# Patient Record
Sex: Male | Born: 1945 | Race: Black or African American | Hispanic: No | Marital: Married | Smoking: Former smoker
Health system: Southern US, Community
[De-identification: ages and names within clinical notes are randomized; demographics above are authoritative.]

## PROBLEM LIST (undated history)

## (undated) DIAGNOSIS — Z974 Presence of external hearing-aid: Secondary | ICD-10-CM

## (undated) DIAGNOSIS — E785 Hyperlipidemia, unspecified: Secondary | ICD-10-CM

## (undated) DIAGNOSIS — H9193 Unspecified hearing loss, bilateral: Secondary | ICD-10-CM

## (undated) DIAGNOSIS — G4733 Obstructive sleep apnea (adult) (pediatric): Secondary | ICD-10-CM

## (undated) DIAGNOSIS — Z8546 Personal history of malignant neoplasm of prostate: Secondary | ICD-10-CM

## (undated) DIAGNOSIS — N489 Disorder of penis, unspecified: Secondary | ICD-10-CM

## (undated) DIAGNOSIS — Z87442 Personal history of urinary calculi: Secondary | ICD-10-CM

## (undated) DIAGNOSIS — J309 Allergic rhinitis, unspecified: Secondary | ICD-10-CM

## (undated) DIAGNOSIS — C61 Malignant neoplasm of prostate: Secondary | ICD-10-CM

## (undated) DIAGNOSIS — I10 Essential (primary) hypertension: Secondary | ICD-10-CM

## (undated) DIAGNOSIS — Z9989 Dependence on other enabling machines and devices: Secondary | ICD-10-CM

## (undated) DIAGNOSIS — C801 Malignant (primary) neoplasm, unspecified: Secondary | ICD-10-CM

## (undated) HISTORY — DX: Malignant (primary) neoplasm, unspecified: C80.1

## (undated) HISTORY — DX: Unspecified hearing loss, bilateral: H91.93

## (undated) HISTORY — PX: INGUINAL HERNIA REPAIR: SUR1180

## (undated) HISTORY — PX: PROSTATE SURGERY: SHX751

## (undated) HISTORY — PX: CARDIAC CATHETERIZATION: SHX172

---

## 2002-09-09 ENCOUNTER — Encounter: Payer: Self-pay | Admitting: Family Medicine

## 2002-09-09 ENCOUNTER — Encounter: Admission: RE | Admit: 2002-09-09 | Discharge: 2002-09-09 | Payer: Self-pay | Admitting: Family Medicine

## 2002-10-21 ENCOUNTER — Encounter: Payer: Self-pay | Admitting: Family Medicine

## 2002-10-21 ENCOUNTER — Encounter: Admission: RE | Admit: 2002-10-21 | Discharge: 2002-10-21 | Payer: Self-pay | Admitting: Family Medicine

## 2006-03-22 ENCOUNTER — Emergency Department (HOSPITAL_COMMUNITY): Admission: EM | Admit: 2006-03-22 | Discharge: 2006-03-22 | Payer: Self-pay | Admitting: Emergency Medicine

## 2006-03-27 ENCOUNTER — Encounter: Admission: RE | Admit: 2006-03-27 | Discharge: 2006-03-27 | Payer: Self-pay | Admitting: Family Medicine

## 2007-12-19 ENCOUNTER — Emergency Department (HOSPITAL_COMMUNITY): Admission: EM | Admit: 2007-12-19 | Discharge: 2007-12-20 | Payer: Self-pay | Admitting: Emergency Medicine

## 2009-07-12 ENCOUNTER — Ambulatory Visit (HOSPITAL_COMMUNITY): Admission: RE | Admit: 2009-07-12 | Discharge: 2009-07-12 | Payer: Self-pay | Admitting: Urology

## 2009-08-04 ENCOUNTER — Ambulatory Visit (HOSPITAL_COMMUNITY): Admission: RE | Admit: 2009-08-04 | Discharge: 2009-08-04 | Payer: Self-pay | Admitting: Urology

## 2009-08-21 ENCOUNTER — Emergency Department (HOSPITAL_COMMUNITY): Admission: EM | Admit: 2009-08-21 | Discharge: 2009-08-21 | Payer: Self-pay | Admitting: Family Medicine

## 2009-11-15 ENCOUNTER — Encounter (INDEPENDENT_AMBULATORY_CARE_PROVIDER_SITE_OTHER): Payer: Self-pay | Admitting: Urology

## 2009-11-15 ENCOUNTER — Inpatient Hospital Stay (HOSPITAL_COMMUNITY): Admission: RE | Admit: 2009-11-15 | Discharge: 2009-11-16 | Payer: Self-pay | Admitting: Urology

## 2009-11-15 HISTORY — PX: ROBOT ASSISTED LAPAROSCOPIC RADICAL PROSTATECTOMY: SHX5141

## 2010-12-19 LAB — COMPREHENSIVE METABOLIC PANEL
Albumin: 4 g/dL (ref 3.5–5.2)
Alkaline Phosphatase: 64 U/L (ref 39–117)
BUN: 10 mg/dL (ref 6–23)
Calcium: 9.3 mg/dL (ref 8.4–10.5)
Creatinine, Ser: 1.15 mg/dL (ref 0.4–1.5)
Glucose, Bld: 116 mg/dL — ABNORMAL HIGH (ref 70–99)
Total Protein: 7.2 g/dL (ref 6.0–8.3)

## 2010-12-19 LAB — BASIC METABOLIC PANEL
CO2: 27 mEq/L (ref 19–32)
Calcium: 8.3 mg/dL — ABNORMAL LOW (ref 8.4–10.5)
Chloride: 107 mEq/L (ref 96–112)
Creatinine, Ser: 1.18 mg/dL (ref 0.4–1.5)
GFR calc Af Amer: >60 mL/min (ref >60)
Glucose, Bld: 141 mg/dL — ABNORMAL HIGH (ref 70–99)

## 2010-12-19 LAB — TYPE AND SCREEN
ABO/RH(D): B POS
Antibody Screen: NEGATIVE

## 2010-12-19 LAB — URINALYSIS, ROUTINE W REFLEX MICROSCOPIC
Bilirubin Urine: NEGATIVE
Glucose, UA: NEGATIVE mg/dL
Hgb urine dipstick: NEGATIVE
Ketones, ur: NEGATIVE mg/dL
Protein, ur: NEGATIVE mg/dL
pH: 7.5 (ref 5.0–8.0)

## 2010-12-19 LAB — CBC
HCT: 43.1 % (ref 39.0–52.0)
Hemoglobin: 14.4 g/dL (ref 13.0–17.0)
MCHC: 33.5 g/dL (ref 30.0–36.0)
MCV: 91.2 fL (ref 78.0–100.0)
Platelets: 173 10*3/uL (ref 150–400)
RDW: 14.6 % (ref 11.5–15.5)

## 2010-12-19 LAB — APTT: aPTT: 35 seconds (ref 24–37)

## 2010-12-19 LAB — PROTIME-INR: INR: 1.05 (ref 0.00–1.49)

## 2010-12-19 LAB — CREATININE, FLUID (PLEURAL, PERITONEAL, JP DRAINAGE): Creat, Fluid: 1.1 mg/dL

## 2010-12-19 LAB — HEMOGLOBIN AND HEMATOCRIT, BLOOD: HCT: 38 % — ABNORMAL LOW (ref 39.0–52.0)

## 2011-01-02 LAB — CBC
HCT: 41.6 % (ref 39.0–52.0)
Hemoglobin: 14.3 g/dL (ref 13.0–17.0)
MCHC: 34.3 g/dL (ref 30.0–36.0)
MCV: 90.8 fL (ref 78.0–100.0)
Platelets: 168 10*3/uL (ref 150–400)
RBC: 4.58 MIL/uL (ref 4.22–5.81)
RDW: 14.6 % (ref 11.5–15.5)
WBC: 6.9 10*3/uL (ref 4.0–10.5)

## 2011-01-02 LAB — POCT I-STAT, CHEM 8
Chloride: 102 mEq/L (ref 96–112)
Creatinine, Ser: 1.2 mg/dL (ref 0.4–1.5)
HCT: 45 % (ref 39.0–52.0)
Hemoglobin: 15.3 g/dL (ref 13.0–17.0)
Potassium: 4.1 mEq/L (ref 3.5–5.1)
Sodium: 138 mEq/L (ref 135–145)

## 2011-01-02 LAB — PROTIME-INR
INR: 1.05 (ref 0.00–1.49)
Prothrombin Time: 13.6 seconds (ref 11.6–15.2)

## 2011-01-02 LAB — APTT: aPTT: 37 seconds (ref 24–37)

## 2011-06-24 LAB — DIFFERENTIAL
Basophils Absolute: 0
Basophils Relative: 0
Eosinophils Absolute: 0.3
Eosinophils Relative: 3
Lymphocytes Relative: 37
Lymphs Abs: 3.5
Monocytes Absolute: 0.8
Monocytes Relative: 8
Neutro Abs: 5
Neutrophils Relative %: 52

## 2011-06-24 LAB — URINALYSIS, ROUTINE W REFLEX MICROSCOPIC
Bilirubin Urine: NEGATIVE
Glucose, UA: NEGATIVE
Hgb urine dipstick: NEGATIVE
Ketones, ur: NEGATIVE
Nitrite: NEGATIVE
Protein, ur: NEGATIVE
Specific Gravity, Urine: 1.01
Urobilinogen, UA: 1
pH: 6.5

## 2011-06-24 LAB — BASIC METABOLIC PANEL
Chloride: 99
GFR calc Af Amer: >60
GFR calc non Af Amer: >60
Potassium: 3 — ABNORMAL LOW
Sodium: 136

## 2011-06-24 LAB — BASIC METABOLIC PANEL WITH GFR
BUN: 16
CO2: 28
Calcium: 9.2
Creatinine, Ser: 1.07
Glucose, Bld: 120 — ABNORMAL HIGH

## 2011-06-24 LAB — CBC
HCT: 43.3
Hemoglobin: 14.3
MCHC: 32.9
MCV: 88.9
Platelets: 212
RBC: 4.87
RDW: 15.6 — ABNORMAL HIGH
WBC: 9.6

## 2011-06-24 LAB — POCT CARDIAC MARKERS
Operator id: 4533
Troponin i, poc: <0.05

## 2012-04-18 ENCOUNTER — Encounter (HOSPITAL_COMMUNITY): Payer: Self-pay | Admitting: *Deleted

## 2012-04-18 ENCOUNTER — Emergency Department (HOSPITAL_COMMUNITY): Payer: BC Managed Care – PPO

## 2012-04-18 ENCOUNTER — Observation Stay (HOSPITAL_COMMUNITY)
Admission: EM | Admit: 2012-04-18 | Discharge: 2012-04-18 | Disposition: A | Discharge status: Home / Self Care | Payer: BC Managed Care – PPO | Attending: Emergency Medicine | Admitting: Emergency Medicine

## 2012-04-18 ENCOUNTER — Observation Stay (HOSPITAL_COMMUNITY): Payer: BC Managed Care – PPO

## 2012-04-18 DIAGNOSIS — E7800 Pure hypercholesterolemia, unspecified: Secondary | ICD-10-CM

## 2012-04-18 DIAGNOSIS — R209 Unspecified disturbances of skin sensation: Secondary | ICD-10-CM | POA: Insufficient documentation

## 2012-04-18 DIAGNOSIS — I10 Essential (primary) hypertension: Principal | ICD-10-CM

## 2012-04-18 DIAGNOSIS — R42 Dizziness and giddiness: Secondary | ICD-10-CM | POA: Insufficient documentation

## 2012-04-18 DIAGNOSIS — R4789 Other speech disturbances: Secondary | ICD-10-CM | POA: Insufficient documentation

## 2012-04-18 DIAGNOSIS — R202 Paresthesia of skin: Secondary | ICD-10-CM

## 2012-04-18 HISTORY — DX: Essential (primary) hypertension: I10

## 2012-04-18 LAB — COMPREHENSIVE METABOLIC PANEL
ALT: 28 U/L (ref 0–53)
AST: 33 U/L (ref 0–37)
AST: 24 U/L (ref 0–37)
Albumin: 3.9 g/dL (ref 3.5–5.2)
Alkaline Phosphatase: 60 U/L (ref 39–117)
BUN: 13 mg/dL (ref 6–23)
CO2: 25 mEq/L (ref 19–32)
Chloride: 103 mEq/L (ref 96–112)
Chloride: 104 mEq/L (ref 96–112)
Creatinine, Ser: 0.97 mg/dL (ref 0.50–1.35)
GFR calc Af Amer: >90 mL/min (ref >90)
GFR calc non Af Amer: 85 mL/min — ABNORMAL LOW (ref >90)
Glucose, Bld: 124 mg/dL — ABNORMAL HIGH (ref 70–99)
Potassium: 3.9 mEq/L (ref 3.5–5.1)
Total Bilirubin: 0.4 mg/dL (ref 0.3–1.2)
Total Bilirubin: 0.3 mg/dL (ref 0.3–1.2)

## 2012-04-18 LAB — CBC
HCT: 40.8 % (ref 39.0–52.0)
Hemoglobin: 14.1 g/dL (ref 13.0–17.0)
MCH: 30.6 pg (ref 26.0–34.0)
MCV: 88.5 fL (ref 78.0–100.0)
Platelets: 188 10*3/uL (ref 150–400)
RBC: 4.61 MIL/uL (ref 4.22–5.81)
WBC: 6.5 10*3/uL (ref 4.0–10.5)

## 2012-04-18 LAB — URINALYSIS, ROUTINE W REFLEX MICROSCOPIC
Bilirubin Urine: NEGATIVE
Hgb urine dipstick: NEGATIVE
Ketones, ur: NEGATIVE mg/dL
Specific Gravity, Urine: 1.013 (ref 1.005–1.030)
Urobilinogen, UA: 0.2 mg/dL (ref 0.0–1.0)

## 2012-04-18 LAB — LIPID PANEL
HDL: 44 mg/dL (ref >39)
LDL Cholesterol: 155 mg/dL — ABNORMAL HIGH (ref 0–99)
Triglycerides: 124 mg/dL (ref <150)
VLDL: 25 mg/dL (ref 0–40)

## 2012-04-18 LAB — CBC WITH DIFFERENTIAL/PLATELET
Basophils Absolute: 0 10*3/uL (ref 0.0–0.1)
Basophils Relative: 0 % (ref 0–1)
Hemoglobin: 14 g/dL (ref 13.0–17.0)
MCHC: 34.6 g/dL (ref 30.0–36.0)
Monocytes Relative: 8 % (ref 3–12)
Neutro Abs: 3 10*3/uL (ref 1.7–7.7)
Neutrophils Relative %: 37 % — ABNORMAL LOW (ref 43–77)
RDW: 14.3 % (ref 11.5–15.5)

## 2012-04-18 LAB — PROTIME-INR: INR: 0.95 (ref 0.00–1.49)

## 2012-04-18 LAB — HEMOGLOBIN A1C: Hgb A1c MFr Bld: 6.1 % — ABNORMAL HIGH (ref <5.7)

## 2012-04-18 MED ORDER — ATORVASTATIN CALCIUM 10 MG PO TABS: 20.0000 mg | ORAL_TABLET | Freq: Every day | ORAL | Status: DC

## 2012-04-18 MED ORDER — ASPIRIN 81 MG PO CHEW: 81.0000 mg | CHEWABLE_TABLET | Freq: Every day | ORAL | Status: AC

## 2012-04-18 MED ORDER — STROKE: EARLY STAGES OF RECOVERY BOOK
Freq: Once | Status: DC
  Filled 2012-04-18: qty 1

## 2012-04-18 NOTE — ED Notes (Signed)
The pt says his bp has been high for the past 2 weeks and his rt foot feels hot and his arms are jumping

## 2012-04-18 NOTE — ED Provider Notes (Signed)
History     CSN: [id number]  Arrival date & time 04/18/12  0127   First MD Initiated Contact with Patient 04/18/12 [phone]      Chief Complaint  Patient presents with  . Hypertension    (Consider location/radiation/quality/duration/timing/severity/associated sxs/prior treatment) HPI History per patient. aRound 1:30 AM today he noted dizziness with slurred speech and right hand tingling. Also some very mild right foot tingling. No weakness. No difficulty with gait. Patient checked his blood pressure noted was systolic 200 and became concerned and presents here for evaluation. States this has happened a few times in the past, has seen his primary care physician and increased his lisinopril. Currently speech has come back to normal. No headache. Symptoms otherwise resolved. No history of stroke or TIA. No chest pain or shortness of breath. No palpitations. No recent illness. Symptoms mild/moderate severity. Past Medical History  Diagnosis Date  . Hypertension     History reviewed. No pertinent past surgical history.  No family history on file.  History  Substance Use Topics  . Smoking status: Never Smoker   . Smokeless tobacco: Not on file  . Alcohol Use: Yes      Review of Systems  Constitutional: Negative for fever and chills.  HENT: Negative for neck pain and neck stiffness.   Eyes: Negative for pain.  Respiratory: Negative for shortness of breath.   Cardiovascular: Negative for chest pain, palpitations and leg swelling.  Gastrointestinal: Negative for abdominal pain.  Genitourinary: Negative for dysuria.  Musculoskeletal: Negative for back pain.  Skin: Negative for rash.  Neurological: Positive for dizziness. Negative for weakness.  All other systems reviewed and are negative.    Allergies  Review of patient's allergies indicates no known allergies.  Home Medications   Current Outpatient Rx  Name Route Sig Dispense Refill  . CETIRIZINE HCL 10 MG PO TABS Oral  Take 10 mg by mouth daily.    Marland Kitchen LISINOPRIL 20 MG PO TABS Oral Take 20 mg by mouth daily.    Marland Kitchen POTASSIUM 95 MG PO TABS Oral Take 1 tablet by mouth daily.      BP 133/71  Pulse 90  Temp 98.6 F (37 C) (Oral)  Resp 18  SpO2 98%  Physical Exam  Constitutional: He is oriented to person, place, and time. He appears well-developed and well-nourished.  HENT:  Head: Normocephalic and atraumatic.  Eyes: Conjunctivae and EOM are normal. Pupils are equal, round, and reactive to light.  Neck: Full passive range of motion without pain. Neck supple. No thyromegaly present.       No meningismus  Cardiovascular: Normal rate, regular rhythm, S1 normal, S2 normal and intact distal pulses.   Pulmonary/Chest: Effort normal and breath sounds normal.  Abdominal: Soft. Bowel sounds are normal. There is no tenderness. There is no CVA tenderness.  Musculoskeletal: Normal range of motion.  Neurological: He is alert and oriented to person, place, and time. He has normal strength and normal reflexes. No cranial nerve deficit or sensory deficit. He displays a negative Romberg sign. GCS eye subscore is 4. GCS verbal subscore is 5. GCS motor subscore is 6.       Normal Gait  Skin: Skin is warm and dry. No rash noted. No cyanosis. Nails show no clubbing.  Psychiatric: He has a normal mood and affect. His speech is normal and behavior is normal.    ED Course  Procedures (including critical care time)  Results for orders placed during the hospital encounter of 04/18/12  CBC WITH DIFFERENTIAL      Component Value Range   WBC 7.9  4.0 - 10.5 K/uL   RBC 4.57  4.22 - 5.81 MIL/uL   Hemoglobin 14.0  13.0 - 17.0 g/dL   HCT 96.0  45.4 - 09.8 %   MCV 88.6  78.0 - 100.0 fL   MCH 30.6  26.0 - 34.0 pg   MCHC 34.6  30.0 - 36.0 g/dL   RDW 11.9  14.7 - 82.9 %   Platelets 178  150 - 400 K/uL   Neutrophils Relative 37 (*) 43 - 77 %   Neutro Abs 3.0  1.7 - 7.7 K/uL   Lymphocytes Relative 51 (*) 12 - 46 %   Lymphs Abs 4.1  (*) 0.7 - 4.0 K/uL   Monocytes Relative 8  3 - 12 %   Monocytes Absolute 0.6  0.1 - 1.0 K/uL   Eosinophils Relative 4  0 - 5 %   Eosinophils Absolute 0.3  0.0 - 0.7 K/uL   Basophils Relative 0  0 - 1 %   Basophils Absolute 0.0  0.0 - 0.1 K/uL  COMPREHENSIVE METABOLIC PANEL      Component Value Range   Sodium 139  135 - 145 mEq/L   Potassium 3.9  3.5 - 5.1 mEq/L   Chloride 103  96 - 112 mEq/L   CO2 22  19 - 32 mEq/L   Glucose, Bld 120 (*) 70 - 99 mg/dL   BUN 14  6 - 23 mg/dL   Creatinine, Ser 5.62  0.50 - 1.35 mg/dL   Calcium 9.0  8.4 - 13.0 mg/dL   Total Protein 7.0  6.0 - 8.3 g/dL   Albumin 3.9  3.5 - 5.2 g/dL   AST 33  0 - 37 U/L   ALT 28  0 - 53 U/L   Alkaline Phosphatase 60  39 - 117 U/L   Total Bilirubin 0.4  0.3 - 1.2 mg/dL   GFR calc non Af Amer 74 (*) >90 mL/min   GFR calc Af Amer 86 (*) >90 mL/min  CBC      Component Value Range   WBC 6.5  4.0 - 10.5 K/uL   RBC 4.61  4.22 - 5.81 MIL/uL   Hemoglobin 14.1  13.0 - 17.0 g/dL   HCT 86.5  78.4 - 69.6 %   MCV 88.5  78.0 - 100.0 fL   MCH 30.6  26.0 - 34.0 pg   MCHC 34.6  30.0 - 36.0 g/dL   RDW 29.5  28.4 - 13.2 %   Platelets 188  150 - 400 K/uL  COMPREHENSIVE METABOLIC PANEL      Component Value Range   Sodium 140  135 - 145 mEq/L   Potassium 4.0  3.5 - 5.1 mEq/L   Chloride 104  96 - 112 mEq/L   CO2 25  19 - 32 mEq/L   Glucose, Bld 124 (*) 70 - 99 mg/dL   BUN 13  6 - 23 mg/dL   Creatinine, Ser 4.40  0.50 - 1.35 mg/dL   Calcium 9.1  8.4 - 10.2 mg/dL   Total Protein 7.0  6.0 - 8.3 g/dL   Albumin 3.8  3.5 - 5.2 g/dL   AST 24  0 - 37 U/L   ALT 25  0 - 53 U/L   Alkaline Phosphatase 57  39 - 117 U/L   Total Bilirubin 0.3  0.3 - 1.2 mg/dL   GFR calc non Af Amer 85 (*) >  90 mL/min   GFR calc Af Amer >90  >90 mL/min  PROTIME-INR      Component Value Range   Prothrombin Time 12.9  11.6 - 15.2 seconds   INR 0.95  0.00 - 1.49  APTT      Component Value Range   aPTT 32  24 - 37 seconds  URINALYSIS, ROUTINE W REFLEX  MICROSCOPIC      Component Value Range   Color, Urine YELLOW  YELLOW   APPearance CLEAR  CLEAR   Specific Gravity, Urine 1.013  1.005 - 1.030   pH 7.0  5.0 - 8.0   Glucose, UA NEGATIVE  NEGATIVE mg/dL   Hgb urine dipstick NEGATIVE  NEGATIVE   Bilirubin Urine NEGATIVE  NEGATIVE   Ketones, ur NEGATIVE  NEGATIVE mg/dL   Protein, ur NEGATIVE  NEGATIVE mg/dL   Urobilinogen, UA 0.2  0.0 - 1.0 mg/dL   Nitrite NEGATIVE  NEGATIVE   Leukocytes, UA NEGATIVE  NEGATIVE  LIPID PANEL      Component Value Range   Cholesterol 224 (*) 0 - 200 mg/dL   Triglycerides 308  <657 mg/dL   HDL 44  >84 mg/dL   Total CHOL/HDL Ratio 5.1     VLDL 25  0 - 40 mg/dL   LDL Cholesterol 696 (*) 0 - 99 mg/dL   Ct Head Wo Contrast  04/18/2012  *RADIOLOGY REPORT*  Clinical Data: Dizziness.  Tingling in the right fingers. Hypertension.  CT HEAD WITHOUT CONTRAST  Technique:  Contiguous axial images were obtained from the base of the skull through the vertex without contrast.  Comparison: 12/20/2007  Findings: Faint calcification in the globus pallidus nuclei noted, left greater than right, stable and likely physiologic.  Otherwise, the brain stem, cerebellum, cerebral peduncles, thalami, basal ganglia, basilar cisterns, and ventricular system appear unremarkable.  No intracranial hemorrhage, mass lesion, or acute infarction is identified.  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  IMPRESSION:  1.  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   Otherwise, no significant abnormality identified.  Original Report Authenticated By: Dellia Cloud, M.D.   Dg Chest Portable 1 View  04/18/2012  *RADIOLOGY REPORT*  Clinical Data: Hypertension.  Lightheaded.  Chills.  Numbness in the right hand.  PORTABLE CHEST - 1 VIEW  Comparison: 11/13/2009  Findings: Shallow inspiration. The heart size and pulmonary vascularity are normal. The lungs appear clear and expanded without focal air space disease or consolidation.  No blunting of the costophrenic angles.  No significant change since previous study.  IMPRESSION: No evidence of active pulmonary disease.  Original Report Authenticated By: Marlon Pel, M.D.     Date: 04/18/2012  Rate: 74  Rhythm: normal sinus rhythm  QRS Axis: normal  Intervals: normal  ST/T Wave abnormalities: nonspecific ST changes  Conduction Disutrbances:none  Narrative Interpretation:   Old EKG Reviewed: unchanged  Patient placed on CDU observation TIA protocol.  [facility] patient. TIA symptoms. ABCD squared score = 5 MDM   Nursing notes reviewed. Vital signs reviewed. Blood pressure improving. Imaging, EKG and labs obtained and reviewed as above. On recheck at 5 AM exam unchanged.        Sunnie Nielsen, MD 04/18/12 [phone]

## 2012-04-18 NOTE — ED Notes (Signed)
Patient transported to CT 

## 2012-04-18 NOTE — ED Provider Notes (Signed)
Pt seen and examined by me in CDU. Pt on TIA protochol over night. Pt with an episode of hypertension and tingling in right hand and right leg yesterday, all now resolved. Pending pt's echo.   Results for orders placed during the hospital encounter of 04/18/12  CBC WITH DIFFERENTIAL      Component Value Range   WBC 7.9  4.0 - 10.5 K/uL   RBC 4.57  4.22 - 5.81 MIL/uL   Hemoglobin 14.0  13.0 - 17.0 g/dL   HCT 09.8  11.9 - 14.7 %   MCV 88.6  78.0 - 100.0 fL   MCH 30.6  26.0 - 34.0 pg   MCHC 34.6  30.0 - 36.0 g/dL   RDW 82.9  56.2 - 13.0 %   Platelets 178  150 - 400 K/uL   Neutrophils Relative 37 (*) 43 - 77 %   Neutro Abs 3.0  1.7 - 7.7 K/uL   Lymphocytes Relative 51 (*) 12 - 46 %   Lymphs Abs 4.1 (*) 0.7 - 4.0 K/uL   Monocytes Relative 8  3 - 12 %   Monocytes Absolute 0.6  0.1 - 1.0 K/uL   Eosinophils Relative 4  0 - 5 %   Eosinophils Absolute 0.3  0.0 - 0.7 K/uL   Basophils Relative 0  0 - 1 %   Basophils Absolute 0.0  0.0 - 0.1 K/uL  COMPREHENSIVE METABOLIC PANEL      Component Value Range   Sodium 139  135 - 145 mEq/L   Potassium 3.9  3.5 - 5.1 mEq/L   Chloride 103  96 - 112 mEq/L   CO2 22  19 - 32 mEq/L   Glucose, Bld 120 (*) 70 - 99 mg/dL   BUN 14  6 - 23 mg/dL   Creatinine, Ser 8.65  0.50 - 1.35 mg/dL   Calcium 9.0  8.4 - 78.4 mg/dL   Total Protein 7.0  6.0 - 8.3 g/dL   Albumin 3.9  3.5 - 5.2 g/dL   AST 33  0 - 37 U/L   ALT 28  0 - 53 U/L   Alkaline Phosphatase 60  39 - 117 U/L   Total Bilirubin 0.4  0.3 - 1.2 mg/dL   GFR calc non Af Amer 74 (*) >90 mL/min   GFR calc Af Amer 86 (*) >90 mL/min  CBC      Component Value Range   WBC 6.5  4.0 - 10.5 K/uL   RBC 4.61  4.22 - 5.81 MIL/uL   Hemoglobin 14.1  13.0 - 17.0 g/dL   HCT 69.6  29.5 - 28.4 %   MCV 88.5  78.0 - 100.0 fL   MCH 30.6  26.0 - 34.0 pg   MCHC 34.6  30.0 - 36.0 g/dL   RDW 13.2  44.0 - 10.2 %   Platelets 188  150 - 400 K/uL  COMPREHENSIVE METABOLIC PANEL      Component Value Range   Sodium 140  135 -  145 mEq/L   Potassium 4.0  3.5 - 5.1 mEq/L   Chloride 104  96 - 112 mEq/L   CO2 25  19 - 32 mEq/L   Glucose, Bld 124 (*) 70 - 99 mg/dL   BUN 13  6 - 23 mg/dL   Creatinine, Ser 7.25  0.50 - 1.35 mg/dL   Calcium 9.1  8.4 - 36.6 mg/dL   Total Protein 7.0  6.0 - 8.3 g/dL   Albumin 3.8  3.5 - 5.2  g/dL   AST 24  0 - 37 U/L   ALT 25  0 - 53 U/L   Alkaline Phosphatase 57  39 - 117 U/L   Total Bilirubin 0.3  0.3 - 1.2 mg/dL   GFR calc non Af Amer 85 (*) >90 mL/min   GFR calc Af Amer >90  >90 mL/min  PROTIME-INR      Component Value Range   Prothrombin Time 12.9  11.6 - 15.2 seconds   INR 0.95  0.00 - 1.49  APTT      Component Value Range   aPTT 32  24 - 37 seconds  URINALYSIS, ROUTINE W REFLEX MICROSCOPIC      Component Value Range   Color, Urine YELLOW  YELLOW   APPearance CLEAR  CLEAR   Specific Gravity, Urine 1.013  1.005 - 1.030   pH 7.0  5.0 - 8.0   Glucose, UA NEGATIVE  NEGATIVE mg/dL   Hgb urine dipstick NEGATIVE  NEGATIVE   Bilirubin Urine NEGATIVE  NEGATIVE   Ketones, ur NEGATIVE  NEGATIVE mg/dL   Protein, ur NEGATIVE  NEGATIVE mg/dL   Urobilinogen, UA 0.2  0.0 - 1.0 mg/dL   Nitrite NEGATIVE  NEGATIVE   Leukocytes, UA NEGATIVE  NEGATIVE  LIPID PANEL      Component Value Range   Cholesterol 224 (*) 0 - 200 mg/dL   Triglycerides 062  <694 mg/dL   HDL 44  >85 mg/dL   Total CHOL/HDL Ratio 5.1     VLDL 25  0 - 40 mg/dL   LDL Cholesterol 462 (*) 0 - 99 mg/dL   Ct Head Wo Contrast  04/18/2012  *RADIOLOGY REPORT*  Clinical Data: Dizziness.  Tingling in the right fingers. Hypertension.  CT HEAD WITHOUT CONTRAST  Technique:  Contiguous axial images were obtained from the base of the skull through the vertex without contrast.  Comparison: 12/20/2007  Findings: Faint calcification in the globus pallidus nuclei noted, left greater than right, stable and likely physiologic.  Otherwise, the brain stem, cerebellum, cerebral peduncles, thalami, basal ganglia, basilar cisterns, and  ventricular system appear unremarkable.  No intracranial hemorrhage, mass lesion, or acute infarction is identified.  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  IMPRESSION:  1.  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   Otherwise, no significant abnormality identified.  Original Report Authenticated By: Dellia Cloud, M.D.   Mr Brain Wo Contrast  04/18/2012  *RADIOLOGY REPORT*  Clinical Data:  66 year old male with dizziness, slurred speech, right hand tingling.  Comparison: Head CT 04/18/2012 and earlier.  MRI HEAD WITHOUT CONTRAST  Technique: Multiplanar, multiecho pulse sequences of the brain and surrounding structures were obtained according to standard protocol without intravenous contrast.  Findings: No restricted diffusion to suggest acute infarction. Major intracranial vascular flow voids are preserved; dominant distal left vertebral artery suspected.  Partially empty sella configuration.  No ventriculomegaly. No midline shift, mass effect, or evidence of mass lesion.  No acute intracranial hemorrhage identified.  Negative cervicomedullary junction and visualized cervical spine.  Normal bone marrow signal. Gray-white matter signal is within normal limits for age throughout the brain.  Minor paranasal sinus mucosal thickening.  Mastoids are clear. Visualized orbits and scalp soft tissues are within normal limits.  IMPRESSION: 1. No acute intracranial abnormality.  Normal for age noncontrast MRI appearance of the brain. 2.  MRA findings are below.  MRA HEAD WITHOUT CONTRAST  Technique: Angiographic images of the Circle of Willis were obtained using MRA technique without  intravenous contrast.  Findings: Antegrade flow in the posterior circulation which is supplied by the distal left vertebral artery.  A diminutive right vertebral artery terminates in PICA.  Normal left PICA.  No basilar stenosis.  Normal superior cerebellar and posterior cerebral artery origins.  Normal  right posterior communicating artery.  The left is diminutive.  Bilateral PCA branches are within normal limits.  Antegrade flow in both ICA siphons.  Bilateral cavernous segment irregularity compatible with atherosclerosis.  No hemodynamically significant ICA stenosis.  The ophthalmic and right posterior communicating artery origins are within normal limits.  The left posterior communicating artery origin is not well visualized, but a 2 mm outpouching from the distal left ICA directed inferiorly probably is an infundibulum at the left Pcomm origin.  Ectatic right ICA terminus.  MCA and ACA origins are within normal limits.  Diminutive anterior communicating artery.  Visualized ACA branches are within normal limits.  Visualized right MCA branches are within normal limits.  Visualized left MCA branches are within normal limits.  IMPRESSION: 1.  No hemodynamically significant stenosis or major circle of Willis branch occlusion. 2.  ICA siphon atherosclerosis. 3.  2 mm distal left ICA infundibulum. 4.  Dominant left vertebral artery, the right terminates in PICA.  Original Report Authenticated By: Harley Hallmark, M.D.   Dg Chest Portable 1 View  04/18/2012  *RADIOLOGY REPORT*  Clinical Data: Hypertension.  Lightheaded.  Chills.  Numbness in the right hand.  PORTABLE CHEST - 1 VIEW  Comparison: 11/13/2009  Findings: Shallow inspiration. The heart size and pulmonary vascularity are normal. The lungs appear clear and expanded without focal air space disease or consolidation. No blunting of the costophrenic angles.  No significant change since previous study.  IMPRESSION: No evidence of active pulmonary disease.  Original Report Authenticated By: Marlon Pel, M.D.   Mr Mra Head/brain Wo Cm  04/18/2012  *RADIOLOGY REPORT*  Clinical Data:  66 year old male with dizziness, slurred speech, right hand tingling.  Comparison: Head CT 04/18/2012 and earlier.  MRI HEAD WITHOUT CONTRAST  Technique: Multiplanar,  multiecho pulse sequences of the brain and surrounding structures were obtained according to standard protocol without intravenous contrast.  Findings: No restricted diffusion to suggest acute infarction. Major intracranial vascular flow voids are preserved; dominant distal left vertebral artery suspected.  Partially empty sella configuration.  No ventriculomegaly. No midline shift, mass effect, or evidence of mass lesion.  No acute intracranial hemorrhage identified.  Negative cervicomedullary junction and visualized cervical spine.  Normal bone marrow signal. Gray-white matter signal is within normal limits for age throughout the brain.  Minor paranasal sinus mucosal thickening.  Mastoids are clear. Visualized orbits and scalp soft tissues are within normal limits.  IMPRESSION: 1. No acute intracranial abnormality.  Normal for age noncontrast MRI appearance of the brain. 2.  MRA findings are below.  MRA HEAD WITHOUT CONTRAST  Technique: Angiographic images of the Circle of Willis were obtained using MRA technique without  intravenous contrast.  Findings: Antegrade flow in the posterior circulation which is supplied by the distal left vertebral artery.  A diminutive right vertebral artery terminates in PICA.  Normal left PICA.  No basilar stenosis.  Normal superior cerebellar and posterior cerebral artery origins.  Normal right posterior communicating artery.  The left is diminutive.  Bilateral PCA branches are within normal limits.  Antegrade flow in both ICA siphons.  Bilateral cavernous segment irregularity compatible with atherosclerosis.  No hemodynamically significant ICA stenosis.  The ophthalmic and right posterior communicating  artery origins are within normal limits.  The left posterior communicating artery origin is not well visualized, but a 2 mm outpouching from the distal left ICA directed inferiorly probably is an infundibulum at the left Pcomm origin.  Ectatic right ICA terminus.  MCA and ACA  origins are within normal limits.  Diminutive anterior communicating artery.  Visualized ACA branches are within normal limits.  Visualized right MCA branches are within normal limits.  Visualized left MCA branches are within normal limits.  IMPRESSION: 1.  No hemodynamically significant stenosis or major circle of Willis branch occlusion. 2.  ICA siphon atherosclerosis. 3.  2 mm distal left ICA infundibulum. 4.  Dominant left vertebral artery, the right terminates in PICA.  Original Report Authenticated By: Harley Hallmark, M.D.    12:54 PM Pt's work up here unremarkable other than elevated cholesterol of 224, with LDL of 155. Pt states he used to be on medications, does not remember the name, but came off about 2 years ago because his cholesterol was improving. Pt states he has also had an allergic reaction to some medication, he does not remember the name of it, also does not remember what type of reaction he had. Pt's wife out of the room now, will ask her when she comes back. Pt seems to think she would know.   Exam.: Pt in NAD, PERRLA, neck is supple, no bruits. Lungs are clear to ausculation bilat, regular HR and rhythm. Abdomen soft, non tender. 5/5 and equal strength of both extremities, equal grip strength bilaterally, cranial nerves intact.  Filed Vitals:   04/18/12 1000  BP: 142/78  Pulse: 58  Temp:   Resp: 13    Plan: Pt will be started on a cholesterol medication. Follow up with PCP. He is a [place] patient. Also follow up for better BP control.     Lottie Mussel, PA 04/18/12 2252

## 2012-04-18 NOTE — Progress Notes (Signed)
  Echocardiogram 2D Echocardiogram has been performed.  [place] 04/18/2012, 9:32 AM

## 2012-04-18 NOTE — Progress Notes (Signed)
VASCULAR LAB PRELIMINARY  PRELIMINARY  PRELIMINARY  PRELIMINARY  Carotid duplex  completed.    Preliminary report:  Bilateral:  No evidence of hemodynamically significant internal carotid artery stenosis.   Vertebral artery flow is antegrade.      Kale Rondeau, RVT 04/18/2012, 8:41 AM

## 2012-04-18 NOTE — ED Notes (Signed)
Pt's belongings which were shoes, pants, belt, shirt, and hat were taken to CDU 12.

## 2012-04-18 NOTE — ED Notes (Addendum)
Report given to [facility], RN and pt will be transferred to CDU 12 after MRI.

## 2012-04-19 NOTE — ED Provider Notes (Signed)
Medical screening examination/treatment/procedure(s) were performed by non-physician practitioner and as supervising physician I was immediately available for consultation/collaboration.  Sunnie Nielsen, MD 04/19/12 [phone]

## 2012-05-13 NOTE — Progress Notes (Signed)
Observation review is complete for this date.

## 2012-06-30 ENCOUNTER — Emergency Department (INDEPENDENT_AMBULATORY_CARE_PROVIDER_SITE_OTHER)
Admission: EM | Admit: 2012-06-30 | Discharge: 2012-06-30 | Disposition: A | Discharge status: Nursing Home (Includes ICF) | Payer: BC Managed Care – PPO | Source: Home / Self Care | Attending: Emergency Medicine | Admitting: Emergency Medicine

## 2012-06-30 ENCOUNTER — Observation Stay (HOSPITAL_COMMUNITY)
Admission: EM | Admit: 2012-06-30 | Discharge: 2012-07-01 | Disposition: A | Discharge status: Home / Self Care | Payer: BC Managed Care – PPO | Attending: Internal Medicine | Admitting: Internal Medicine

## 2012-06-30 ENCOUNTER — Emergency Department (HOSPITAL_COMMUNITY): Payer: BC Managed Care – PPO

## 2012-06-30 ENCOUNTER — Encounter (HOSPITAL_COMMUNITY): Payer: Self-pay | Admitting: Adult Health

## 2012-06-30 ENCOUNTER — Encounter (HOSPITAL_COMMUNITY): Payer: Self-pay | Admitting: *Deleted

## 2012-06-30 DIAGNOSIS — I10 Essential (primary) hypertension: Secondary | ICD-10-CM

## 2012-06-30 DIAGNOSIS — R61 Generalized hyperhidrosis: Secondary | ICD-10-CM | POA: Insufficient documentation

## 2012-06-30 DIAGNOSIS — R42 Dizziness and giddiness: Secondary | ICD-10-CM

## 2012-06-30 DIAGNOSIS — R110 Nausea: Secondary | ICD-10-CM | POA: Insufficient documentation

## 2012-06-30 DIAGNOSIS — R079 Chest pain, unspecified: Secondary | ICD-10-CM

## 2012-06-30 DIAGNOSIS — E785 Hyperlipidemia, unspecified: Secondary | ICD-10-CM | POA: Insufficient documentation

## 2012-06-30 DIAGNOSIS — R202 Paresthesia of skin: Secondary | ICD-10-CM

## 2012-06-30 DIAGNOSIS — Z8249 Family history of ischemic heart disease and other diseases of the circulatory system: Secondary | ICD-10-CM | POA: Insufficient documentation

## 2012-06-30 DIAGNOSIS — I208 Other forms of angina pectoris: Secondary | ICD-10-CM

## 2012-06-30 DIAGNOSIS — R55 Syncope and collapse: Secondary | ICD-10-CM | POA: Insufficient documentation

## 2012-06-30 DIAGNOSIS — R0602 Shortness of breath: Secondary | ICD-10-CM | POA: Insufficient documentation

## 2012-06-30 DIAGNOSIS — R911 Solitary pulmonary nodule: Secondary | ICD-10-CM | POA: Insufficient documentation

## 2012-06-30 DIAGNOSIS — R209 Unspecified disturbances of skin sensation: Secondary | ICD-10-CM

## 2012-06-30 DIAGNOSIS — R072 Precordial pain: Secondary | ICD-10-CM

## 2012-06-30 HISTORY — DX: Malignant neoplasm of prostate: C61

## 2012-06-30 HISTORY — DX: Hyperlipidemia, unspecified: E78.5

## 2012-06-30 LAB — BASIC METABOLIC PANEL
BUN: 15 mg/dL (ref 6–23)
CO2: 26 mEq/L (ref 19–32)
Chloride: 99 mEq/L (ref 96–112)
GFR calc non Af Amer: 61 mL/min — ABNORMAL LOW (ref >90)
Glucose, Bld: 107 mg/dL — ABNORMAL HIGH (ref 70–99)
Potassium: 4.4 mEq/L (ref 3.5–5.1)

## 2012-06-30 LAB — CBC
HCT: 46.3 % (ref 39.0–52.0)
Hemoglobin: 15.6 g/dL (ref 13.0–17.0)
WBC: 8.9 10*3/uL (ref 4.0–10.5)

## 2012-06-30 LAB — POCT I-STAT TROPONIN I: Troponin i, poc: 0 ng/mL (ref 0.00–0.08)

## 2012-06-30 MED ORDER — ASPIRIN 81 MG PO CHEW
324.0000 mg | CHEWABLE_TABLET | Freq: Once | ORAL | Status: AC
  Administered 2012-06-30: 324 mg via ORAL

## 2012-06-30 MED ORDER — NITROGLYCERIN 0.4 MG SL SUBL: 0.4000 mg | SUBLINGUAL_TABLET | SUBLINGUAL | Status: DC | PRN

## 2012-06-30 MED ORDER — ASPIRIN 325 MG PO TABS
325.0000 mg | ORAL_TABLET | ORAL | Status: AC
  Administered 2012-06-30: 325 mg via ORAL
  Filled 2012-06-30: qty 1

## 2012-06-30 MED ORDER — ASPIRIN 81 MG PO CHEW
CHEWABLE_TABLET | ORAL | Status: AC
  Filled 2012-06-30: qty 1

## 2012-06-30 NOTE — H&P (Signed)
PCP: Renne Musca  HPI:  Dalton Martin is a 66 y/o male with h/o HTN, HL and prostate cancer (s/p prostatectomy). Who presents with CP.  He denies any h/o known heart disease. Never had a stress test or cath. Had right hand and leg numbness in 7/13. Came to the ER and had TIA protocol. Echo EF 60-65%. Carotids normal. Head MRI normal.  Over past few months has had multiple episodes of presyncope. Then develops soreness/pressure in chest. Mild dyspnea. No associated diaphoresis, n/v. Typically BP goes up with these events up to 180. Then goes away slowly. No flushing or palpitations. Episodes getting more frequent - has had several episodes in the past week.  This morning BP was fine with systolics in 110-120s. Was able to walk 30 mins on treadmill this morning and felt fine. Tonight was going out to buy some gas and then began to feel lightheaded and developed pressure in his chest. Felt like he couldn't make it back home so went to Urgent Care and was brought to ER. ECG and cardiac markers normal. Now feels fine. Has been checking BP a lot recently. BP good in am then goes into 180s at night.  No seizure activity. No orthostasis.   Review of Systems:     Cardiac Review of Systems: {Y] = yes [ ]  = no  Chest Pain [ y  ]  Resting SOB [   ] Exertional SOB  [  ]  Orthopnea [  ]   Pedal Edema [   ]    Palpitations [  ] Syncope  [  ]   Presyncope [place]   ]  General Review of Systems: [Y] = yes [  ]=no Constitional: recent weight change [  ]; anorexia [  ]; fatigue [  ]; nausea [  ]; night sweats [  ]; fever [  ]; or chills [  ];                                                                                                                                          Dental: poor dentition[  ];   Eye : blurred vision [  ]; diplopia [   ]; vision changes [  ];  Amaurosis fugax[  ]; Resp: cough [  ];  wheezing[  ];  hemoptysis[  ]; shortness of breath[  ]; paroxysmal nocturnal dyspnea[  ]; dyspnea on  exertion[  ]; or orthopnea[  ];  GI:  gallstones[  ], vomiting[  ];  dysphagia[  ]; melena[  ];  hematochezia [  ]; heartburn[  ];  GU: kidney stones [  ]; hematuria[  ];   dysuria [  ];  nocturia[  ];  history of     obstruction [  ];                 Skin: rash, swelling[  ];,  hair loss[  ];  peripheral edema[  ];  or itching[  ]; Musculosketetal: myalgias[  ];  joint swelling[  ];  joint erythema[  ];  joint pain[  ];  back pain[  ];  Heme/Lymph: bruising[  ];  bleeding[  ];  anemia[  ];  Neuro: TIA[  ];  headaches[  ];  stroke[  ];  vertigo[  ];  seizures[  ];   paresthesias[  ];  difficulty walking[  ];  Psych:depression[  ]; anxiety[  ];  Endocrine: diabetes[  ];  thyroid dysfunction[  ];  Other:  Past Medical History  Diagnosis Date  . Hypertension   . Hyperlipidemia   . Prostate cancer     s/p prostatectomy 2011     No Known Allergies  History   Social History  . Marital Status: Married    Spouse Name: N/A    Number of Children: N/A  . Years of Education: N/A   Occupational History  . Not on file.   Social History Main Topics  . Smoking status: Never Smoker   . Smokeless tobacco: Not on file  . Alcohol Use: Yes  . Drug Use: No  . Sexually Active:    Other Topics Concern  . Not on file   Social History Narrative  . No narrative on file    History reviewed. No pertinent family history.  Brother died MI at age 38 Mother died with cancer Father died with cancer 65 4 sisters alive and well  PHYSICAL EXAM: Filed Vitals:   06/30/12 2323  BP: 150/78  Pulse: 65  Temp:   Resp: 19   General:  Well appearing. No respiratory difficulty HEENT: normal Neck: supple. no JVD. Carotids 2+ bilat; no bruits. No lymphadenopathy or thryomegaly appreciated. Cor: PMI nondisplaced. Regular rate & rhythm. No rubs, gallops or murmurs. Lungs: clear Abdomen: soft, nontender, nondistended. No hepatosplenomegaly. No bruits or masses. Good bowel sounds. Extremities: no  cyanosis, clubbing, rash, edema Neuro: alert & oriented x 3, cranial nerves grossly intact. moves all 4 extremities w/o difficulty. Affect pleasant.  ECG: Sinus brady 58 No ST-T wave abnormalities.    Results for orders placed during the hospital encounter of 06/30/12 (from the past 24 hour(s))  CBC     Status: Normal   Collection Time   06/30/12  5:34 PM      Component Value Range   WBC 8.9  4.0 - 10.5 K/uL   RBC 5.19  4.22 - 5.81 MIL/uL   Hemoglobin 15.6  13.0 - 17.0 g/dL   HCT 16.1  09.6 - 04.5 %   MCV 89.2  78.0 - 100.0 fL   MCH 30.1  26.0 - 34.0 pg   MCHC 33.7  30.0 - 36.0 g/dL   RDW 40.9  81.1 - 91.4 %   Platelets 194  150 - 400 K/uL  BASIC METABOLIC PANEL     Status: Abnormal   Collection Time   06/30/12  5:34 PM      Component Value Range   Sodium 137  135 - 145 mEq/L   Potassium 4.4  3.5 - 5.1 mEq/L   Chloride 99  96 - 112 mEq/L   CO2 26  19 - 32 mEq/L   Glucose, Bld 107 (*) 70 - 99 mg/dL   BUN 15  6 - 23 mg/dL   Creatinine, Ser 7.82  0.50 - 1.35 mg/dL   Calcium 95.6  8.4 - 21.3 mg/dL   GFR calc non Af Denyse Dago  61 (*) >90 mL/min   GFR calc Af Amer 71 (*) >90 mL/min  POCT I-STAT TROPONIN I     Status: Normal   Collection Time   06/30/12  5:40 PM      Component Value Range   Troponin i, poc 0.00  0.00 - 0.08 ng/mL   Comment 3            Dg Chest 2 View  06/30/2012  *RADIOLOGY REPORT*  Clinical Data: Chest pain for 1 month  CHEST - 2 VIEW  Comparison: 04/18/2012  Findings: Heart size and vascular pattern are normal.  No infiltrate, consolidation, or effusion.  There is a 3-4 mm nodular opacity laterally in the right upper lobe which was not present previously.  IMPRESSION: No acute findings, but there is a 3-4 mm right upper lobe nodular opacity which was not previously present.  A CT of the thorax either with or without contrast could be considered to further evaluate this finding.   Original Report Authenticated By: Otilio Carpen, M.D.      ASSESSMENT: 1.  Presyncope 2. Substernal chest pressure 3. HTN, uncontrolled 4. Hyperlipidemia 5. RUL lung nodule 6. FHx of CAD - brother died at 92 of MI  PLAN/DISCUSSION:  The etiology of his symptoms remains unclear to me. He has several major risk factors for CAD and is experiencing increasing episodes of presyncope and chest pressure in the setting of HTN. However he is able to exercise without too much difficulty. We discussed possible etiologies including CAD, arrhythmia, pheochromocytoma and hypertensive crisis. We also discussed the possibility of a stress test or cardiac cath to begin the work-up. We have decided to proceed with cath to evaluate his coronaries definitively. If cath ok will need to check event monitor and 24 hour urine for pheo. Will also need better BP control. Will check TSH in am.  Truman Hayward 12:03 AM

## 2012-06-30 NOTE — ED Provider Notes (Signed)
History     CSN: [id number]  Arrival date & time 06/30/12  1557   First MD Initiated Contact with Patient 06/30/12 2155      Chief Complaint  Patient presents with  . Chest Pain    (Consider location/radiation/quality/duration/timing/severity/associated sxs/prior treatment) Patient is a 66 y.o. male presenting with chest pain. The history is provided by the patient and the spouse.  Chest Pain Episode onset: has been intermittant for the last 2 months but worsening. Chest pain occurs intermittently. The chest pain is worsening. The pain is associated with exertion. At its most intense, the pain is at 5/10. The pain is currently at 0/10. The severity of the pain is moderate. The quality of the pain is described as aching and pressure-like. The pain radiates to the left arm. Exacerbated by: symptoms start with exertion and feels like he is going to pass out and then chest pain starts. Primary symptoms include shortness of breath and nausea. Pertinent negatives for primary symptoms include no syncope, no palpitations and no abdominal pain.  Associated symptoms include diaphoresis. He tried nothing for the symptoms. Risk factors include male gender.  Pertinent negatives for past medical history include no CAD, no CHF, no diabetes and no MI.  His family medical history is significant for heart disease in family. Family history comments: brother dropped dead of a MI in the mid 64's     Past Medical History  Diagnosis Date  . Hypertension     History reviewed. No pertinent past surgical history.  History reviewed. No pertinent family history.  History  Substance Use Topics  . Smoking status: Never Smoker   . Smokeless tobacco: Not on file  . Alcohol Use: Yes      Review of Systems  Constitutional: Positive for diaphoresis.  Respiratory: Positive for shortness of breath.   Cardiovascular: Positive for chest pain. Negative for palpitations and syncope.  Gastrointestinal: Positive  for nausea. Negative for abdominal pain.  All other systems reviewed and are negative.    Allergies  Review of patient's allergies indicates no known allergies.  Home Medications   Current Outpatient Rx  Name Route Sig Dispense Refill  . ASPIRIN 81 MG PO CHEW Oral Chew 1 tablet (81 mg total) by mouth daily. 30 tablet 1  . CETIRIZINE HCL 10 MG PO TABS Oral Take 10 mg by mouth daily.    . IBUPROFEN 200 MG PO TABS Oral Take 200 mg by mouth every 6 (six) hours as needed. For pain    . LISINOPRIL 20 MG PO TABS Oral Take 20 mg by mouth daily.    Marland Kitchen POTASSIUM 95 MG PO TABS Oral Take 1 tablet by mouth daily.      BP 163/76  Pulse 76  Temp 97.3 F (36.3 C) (Oral)  Resp 18  SpO2 100%  Physical Exam  Nursing note and vitals reviewed. Constitutional: He is oriented to person, place, and time. He appears well-developed and well-nourished. No distress.  HENT:  Head: Normocephalic and atraumatic.  Mouth/Throat: Oropharynx is clear and moist.  Eyes: Conjunctivae normal and EOM are normal. Pupils are equal, round, and reactive to light.  Neck: Normal range of motion. Neck supple.  Cardiovascular: Normal rate, regular rhythm and intact distal pulses.   No murmur heard. Pulmonary/Chest: Effort normal and breath sounds normal. No respiratory distress. He has no wheezes. He has no rales.  Abdominal: Soft. He exhibits no distension. There is no tenderness. There is no rebound and no guarding.  Musculoskeletal: Normal  range of motion. He exhibits no edema and no tenderness.  Neurological: He is alert and oriented to person, place, and time.  Skin: Skin is warm and dry. No rash noted. No erythema.  Psychiatric: He has a normal mood and affect. His behavior is normal.    ED Course  Procedures (including critical care time)  Labs Reviewed  BASIC METABOLIC PANEL - Abnormal; Notable for the following:    Glucose, Bld 107 (*)     GFR calc non Af Amer 61 (*)     GFR calc Af Amer 71 (*)     All  other components within normal limits  CBC  POCT I-STAT TROPONIN I   Dg Chest 2 View  06/30/2012  *RADIOLOGY REPORT*  Clinical Data: Chest pain for 1 month  CHEST - 2 VIEW  Comparison: 04/18/2012  Findings: Heart size and vascular pattern are normal.  No infiltrate, consolidation, or effusion.  There is a 3-4 mm nodular opacity laterally in the right upper lobe which was not present previously.  IMPRESSION: No acute findings, but there is a 3-4 mm right upper lobe nodular opacity which was not previously present.  A CT of the thorax either with or without contrast could be considered to further evaluate this finding.   Original Report Authenticated By: Otilio Carpen, M.D.     Date: 06/30/2012  Rate: 59  Rhythm: sinus bradycardia  QRS Axis: normal  Intervals: normal  ST/T Wave abnormalities: normal  Conduction Disutrbances:none  Narrative Interpretation:   Old EKG Reviewed: unchanged     1. Stable angina   2. Chest pain       MDM   Pt with symptoms concerning for stable angina.  TIMI 2 for risk factors and daily ASA use.  Associated symptoms include near syncope, SOB, diaphresis and nausea.  ASA given in triage.  On exam pt is symptoms free and has been waiting for awhile sx free.   EKG, CXR, CBC, BMP, CE, Coags without concerning sx.  Discussed with cards as seems that pt would benefit from a cath and they will admit         Gwyneth Sprout, MD 06/30/12 2257

## 2012-06-30 NOTE — ED Notes (Signed)
Reports Left sided chest pain that has been on going since July pain is described as dull and worse with exertion, rest makes pain better associated with light headedness. Denies nausea and SOB. Pt is hypertensive 187/75

## 2012-06-30 NOTE — ED Provider Notes (Signed)
History     CSN: [id number]  Arrival date & time 06/30/12  1457   First MD Initiated Contact with Patient 06/30/12 1506      Chief Complaint  Patient presents with  . Numbness    (Consider location/radiation/quality/duration/timing/severity/associated sxs/prior treatment) HPI Comments: Patient presents urgent care complaining of recurrent abnormal sensation is to the left upper part of his lip and tingling. Patient reports been having since yesterday recurrent left-sided chest pains tightness and pressure type, his last episode was about an hour ago lasted several minutes and he was at rest was not performing any physical activities. Described as pressure, sharp type pain. Has also been expressing this in term attempt abnormal tingling sensation on his left upper lip. Patient denies any cough or shortness of breath, denies any weakness of upper or lower extremities, speech problems, no nausea or sweating.   The history is provided by the patient.    Past Medical History  Diagnosis Date  . Hypertension     History reviewed. No pertinent past surgical history.  Family History  Problem Relation Age of Onset  . Family history unknown: Yes    History  Substance Use Topics  . Smoking status: Never Smoker   . Smokeless tobacco: Not on file  . Alcohol Use: Yes      Review of Systems  Constitutional: Negative for fever, chills, diaphoresis, activity change, appetite change and fatigue.  Respiratory: Negative for cough, choking and shortness of breath.   Cardiovascular: Negative for chest pain and leg swelling.  Skin: Negative for color change, rash and wound.  Neurological: Positive for numbness. Negative for dizziness, tremors, syncope, facial asymmetry, speech difficulty, weakness, light-headedness and headaches.    Allergies  Review of patient's allergies indicates no known allergies.  Home Medications   Current Outpatient Rx  Name Route Sig Dispense Refill  .  ASPIRIN 81 MG PO CHEW Oral Chew 1 tablet (81 mg total) by mouth daily. 30 tablet 1  . ATORVASTATIN CALCIUM 10 MG PO TABS Oral Take 2 tablets (20 mg total) by mouth daily. 30 tablet 1  . CETIRIZINE HCL 10 MG PO TABS Oral Take 10 mg by mouth daily.    Marland Kitchen LISINOPRIL 20 MG PO TABS Oral Take 20 mg by mouth daily.    Marland Kitchen POTASSIUM 95 MG PO TABS Oral Take 1 tablet by mouth daily.      BP 187/73  Pulse 74  Temp 98.3 F (36.8 C) (Oral)  Resp 18  SpO2 97%  Physical Exam  Nursing note and vitals reviewed. Constitutional: Vital signs are normal. He appears well-developed and well-nourished.  Non-toxic appearance. He does not have a sickly appearance. He does not appear ill. No distress.  HENT:  Head: Normocephalic.  Eyes: Conjunctivae normal are normal.  Neck: Neck supple. No JVD present.  Cardiovascular: Normal rate, regular rhythm and normal pulses.  Exam reveals no friction rub.   No murmur heard. Pulmonary/Chest: No respiratory distress. He has no wheezes. He has no rales. He exhibits no tenderness.  Musculoskeletal: Normal range of motion.  Neurological: He is alert.  Skin: No erythema.    ED Course  Procedures (including critical care time)  Labs Reviewed - No data to display No results found.   1. Chest pain   2. Paresthesias     EKG with normal sinus rhythm ventricular rate of 59 beats per minute, no ST or T. elevation or inversion to suggest an acute coronary syndrome.  MDM   Patient presents  urgent care with multiple and recurrent symptoms include left-sided chest pains and perioral paresthesias. Patient is hypertensive, initial neurological assessment revealed no neurological deficits, and reports intermittent left-sided precordial pains. Initial EKG was unremarkable given patient's risk factors and symptomatology we'll transfer to the emergency department in stable condition for further evaluation and monitoring .     Jimmie Molly, MD 06/30/12 [phone]

## 2012-06-30 NOTE — ED Notes (Signed)
Pt reports facial numbness to top lip & "tinge" in chest.the patient states that similar episode happened last week and was evaluated in ed with no definate dx. Rates pain 1/10

## 2012-07-01 ENCOUNTER — Encounter (HOSPITAL_COMMUNITY)
Admission: EM | Disposition: A | Discharge status: Home / Self Care | Payer: Self-pay | Source: Home / Self Care | Attending: Internal Medicine

## 2012-07-01 ENCOUNTER — Encounter (HOSPITAL_COMMUNITY): Payer: Self-pay | Admitting: *Deleted

## 2012-07-01 DIAGNOSIS — I10 Essential (primary) hypertension: Secondary | ICD-10-CM

## 2012-07-01 DIAGNOSIS — R42 Dizziness and giddiness: Secondary | ICD-10-CM

## 2012-07-01 DIAGNOSIS — R079 Chest pain, unspecified: Secondary | ICD-10-CM

## 2012-07-01 HISTORY — PX: LEFT HEART CATHETERIZATION WITH CORONARY ANGIOGRAM: SHX5451

## 2012-07-01 LAB — HEMOGLOBIN A1C: Hgb A1c MFr Bld: 5.9 % — ABNORMAL HIGH (ref <5.7)

## 2012-07-01 LAB — CBC
MCH: 29.5 pg (ref 26.0–34.0)
MCHC: 33.4 g/dL (ref 30.0–36.0)
Platelets: 185 10*3/uL (ref 150–400)
RBC: 4.71 MIL/uL (ref 4.22–5.81)
RDW: 13.8 % (ref 11.5–15.5)

## 2012-07-01 LAB — PROTIME-INR
INR: 1.07 (ref 0.00–1.49)
Prothrombin Time: 13.8 seconds (ref 11.6–15.2)

## 2012-07-01 LAB — TROPONIN I: Troponin I: <0.3 ng/mL (ref <0.30)

## 2012-07-01 LAB — TSH: TSH: 1.014 u[IU]/mL (ref 0.350–4.500)

## 2012-07-01 LAB — LIPID PANEL
Cholesterol: 214 mg/dL — ABNORMAL HIGH (ref 0–200)
Total CHOL/HDL Ratio: 6.1 RATIO

## 2012-07-01 SURGERY — LEFT HEART CATHETERIZATION WITH CORONARY ANGIOGRAM
Anesthesia: LOCAL

## 2012-07-01 MED ORDER — ATORVASTATIN CALCIUM 40 MG PO TABS
40.0000 mg | ORAL_TABLET | Freq: Every day | ORAL | Status: DC
  Filled 2012-07-01: qty 1

## 2012-07-01 MED ORDER — ASPIRIN 81 MG PO CHEW
324.0000 mg | CHEWABLE_TABLET | ORAL | Status: DC
  Filled 2012-07-01: qty 4

## 2012-07-01 MED ORDER — ACETAMINOPHEN 325 MG PO TABS: 650.0000 mg | ORAL_TABLET | ORAL | Status: DC | PRN

## 2012-07-01 MED ORDER — NITROGLYCERIN 0.4 MG SL SUBL: 0.4000 mg | SUBLINGUAL_TABLET | SUBLINGUAL | Status: DC | PRN

## 2012-07-01 MED ORDER — SODIUM CHLORIDE 0.9 % IJ SOLN: 3.0000 mL | INTRAMUSCULAR | Status: DC | PRN

## 2012-07-01 MED ORDER — LIDOCAINE HCL (PF) 1 % IJ SOLN
INTRAMUSCULAR | Status: AC
  Filled 2012-07-01: qty 30

## 2012-07-01 MED ORDER — FENTANYL CITRATE 0.05 MG/ML IJ SOLN
INTRAMUSCULAR | Status: AC
  Filled 2012-07-01: qty 2

## 2012-07-01 MED ORDER — SODIUM CHLORIDE 0.9 % IJ SOLN: 3.0000 mL | Freq: Two times a day (BID) | INTRAMUSCULAR | Status: DC

## 2012-07-01 MED ORDER — VERAPAMIL HCL 2.5 MG/ML IV SOLN
INTRAVENOUS | Status: AC
  Filled 2012-07-01: qty 2

## 2012-07-01 MED ORDER — ONDANSETRON HCL 4 MG/2ML IJ SOLN: 4.0000 mg | Freq: Four times a day (QID) | INTRAMUSCULAR | Status: DC | PRN

## 2012-07-01 MED ORDER — LISINOPRIL 20 MG PO TABS
20.0000 mg | ORAL_TABLET | Freq: Every day | ORAL | Status: DC
  Administered 2012-07-01: 20 mg via ORAL
  Filled 2012-07-01: qty 1

## 2012-07-01 MED ORDER — HEPARIN (PORCINE) IN NACL 2-0.9 UNIT/ML-% IJ SOLN
INTRAMUSCULAR | Status: AC
  Filled 2012-07-01: qty 1000

## 2012-07-01 MED ORDER — ASPIRIN EC 81 MG PO TBEC: 81.0000 mg | DELAYED_RELEASE_TABLET | Freq: Every day | ORAL | Status: DC

## 2012-07-01 MED ORDER — MIDAZOLAM HCL 2 MG/2ML IJ SOLN
INTRAMUSCULAR | Status: AC
  Filled 2012-07-01: qty 2

## 2012-07-01 MED ORDER — LORATADINE 10 MG PO TABS
10.0000 mg | ORAL_TABLET | Freq: Every day | ORAL | Status: DC
  Filled 2012-07-01: qty 1

## 2012-07-01 MED ORDER — ASPIRIN 300 MG RE SUPP: 300.0000 mg | RECTAL | Status: DC

## 2012-07-01 MED ORDER — NITROGLYCERIN 0.2 MG/ML ON CALL CATH LAB
INTRAVENOUS | Status: AC
  Filled 2012-07-01: qty 1

## 2012-07-01 MED ORDER — SODIUM CHLORIDE 0.9 % IV SOLN: INTRAVENOUS | Status: DC

## 2012-07-01 MED ORDER — ASPIRIN 81 MG PO CHEW
324.0000 mg | CHEWABLE_TABLET | ORAL | Status: AC
  Administered 2012-07-01: 324 mg via ORAL

## 2012-07-01 MED ORDER — SODIUM CHLORIDE 0.9 % IV SOLN: INTRAVENOUS | Status: AC

## 2012-07-01 MED ORDER — ASPIRIN 81 MG PO CHEW: 81.0000 mg | CHEWABLE_TABLET | Freq: Every day | ORAL | Status: DC

## 2012-07-01 MED ORDER — ENOXAPARIN SODIUM 40 MG/0.4ML ~~LOC~~ SOLN
40.0000 mg | SUBCUTANEOUS | Status: DC
  Filled 2012-07-01: qty 0.4

## 2012-07-01 MED ORDER — SODIUM CHLORIDE 0.9 % IV SOLN: 250.0000 mL | INTRAVENOUS | Status: DC | PRN

## 2012-07-01 MED ORDER — ACETAMINOPHEN 325 MG PO TABS
650.0000 mg | ORAL_TABLET | ORAL | Status: DC | PRN
  Administered 2012-07-01: 650 mg via ORAL
  Filled 2012-07-01: qty 2

## 2012-07-01 NOTE — Interval H&P Note (Signed)
History and Physical Interval Note:  07/01/2012 2:08 PM  Dalton Martin  has presented today for cardiac cath with the diagnosis of cp  The various methods of treatment have been discussed with the patient and family. After consideration of risks, benefits and other options for treatment, the patient has consented to  Procedure(s) (LRB) with comments: LEFT HEART CATHETERIZATION WITH CORONARY ANGIOGRAM (N/A) as a surgical intervention .  The patient's history has been reviewed, patient examined, no change in status, stable for surgery.  I have reviewed the patient's chart and labs.  Questions were answered to the patient's satisfaction.     Summar [name]

## 2012-07-01 NOTE — Discharge Summary (Signed)
Patient ID: Dalton Martin,  MRN: [id number], DOB/AGE: 64-Sep-1947 64 y.o.  Admit date: 06/30/2012 Discharge date: 07/01/2012  [facility] Provider[facility]  Discharge Diagnoses Primary Diagnosis:  Presyncope  Secondary Diagnoses:  Substernal chest pain without objective evidence of   ischemia  Hypertension, uncontrolled  Hyperlipidemia  Prostate Cancer s/p prostatectomy  Allergies No Known Allergies  Procedures  Cardiac Catheterization 07/01/2012  Hemodynamic Findings: Central aortic pressure: 116/65 Left ventricular pressure: 126/6/11  Angiographic Findings:  Left main: No obstructive disease.   Left Anterior Descending Artery: Large caliber vessel that courses to the apex. Moderate sized diagonal branch with no disease.   Circumflex Artery: Moderate sized vessel with no disease noted.  Right Coronary Artery: Large caliber, dominant vessel with no disease.  Left Ventricular Angiogram: LVEF 60-65%.  _____________  History of Present Illness  66 y/o male without prior cardiac history who presented to the [facility] ED on 06/30/2012 with complaints of presyncope associated with substernal chest pain, which had been occurring intermittently over a several week period but acutely worsened on the day of admission.  He was initially seen at an urgent care and later referred to the ED where ECG and cardiac markers were unremarkable.  He was placed in observation for further evaluation.  Hospital Course  Pt r/o for MI.  Because of his risk factors for CAD along with symptoms concerning for unstable angina, decision was made to pursue diagnostic catheterization.  This was performed this AM and showed normal coronary arteries and normal LV function.  He has had no further chest pain or lightheadedness.  He has had no events on telemetry.  He will be discharged home this evening in good condition.  Discharge Vitals Blood pressure 120/78, pulse 97, temperature 98.7 F (37.1 C),  temperature source Oral, resp. rate 20, height 6\' 1"  (1.854 m), weight 242 lb (109.77 kg), SpO2 95.00%.  Filed Weights   07/01/12 0203  Weight: 242 lb (109.77 kg)   Labs  CBC  Basename 07/01/12 0602 06/30/12 1734  WBC 8.2 8.9  NEUTROABS -- --  HGB 13.9 15.6  HCT 41.6 46.3  MCV 88.3 89.2  PLT 185 194   Basic Metabolic Panel  Basename 06/30/12 1734  NA 137  K 4.4  CL 99  CO2 26  GLUCOSE 107*  BUN 15  CREATININE 1.22  CALCIUM 10.1  MG --  PHOS --   Cardiac Enzymes  Basename 07/01/12 0602  CKTOTAL --  CKMB --  CKMBINDEX --  TROPONINI <0.30   Hemoglobin A1C  Basename 07/01/12 0602  HGBA1C 5.9*   Fasting Lipid Panel  Basename 07/01/12 0602  CHOL 214*  HDL 35*  LDLCALC 144*  TRIG 176*  CHOLHDL 6.1  LDLDIRECT --   Thyroid Function Tests  Basename 07/01/12 0602  TSH 1.014  T4TOTAL --  T3FREE --  THYROIDAB --   Disposition  Pt is being discharged home today in good condition.  Follow-up Plans & Appointments  Follow-up Information    Follow up with [facility] (1-2 wks)         Discharge Medications    Medication List     As of 07/01/2012  4:16 PM    TAKE these medications         aspirin 81 MG chewable tablet   Chew 1 tablet (81 mg total) by mouth daily.      cetirizine 10 MG tablet   Commonly known as: ZYRTEC   Take 10 mg by mouth daily.  ibuprofen 200 MG tablet   Commonly known as: ADVIL,MOTRIN   Take 200 mg by mouth every 6 (six) hours as needed. For pain      lisinopril 20 MG tablet   Commonly known as: PRINIVIL,ZESTRIL   Take 20 mg by mouth daily.      Potassium 95 MG Tabs   Take 1 tablet by mouth daily.      Outstanding Labs/Studies  None  Duration of Discharge Encounter   Greater than 30 minutes including physician time.  Signed, Nicolasa Ducking NP 07/01/2012, 4:16 PM

## 2012-07-01 NOTE — Progress Notes (Signed)
Pt had his pre cath fluids started  0.9% saline at 75cc/hr into his left ac. Pt also received his 324mg  of Asprin. Will cont to monitor pt.

## 2012-07-01 NOTE — H&P (View-Only) (Signed)
   SUBJECTIVE: He is chest pain free. Ruled out for MI. Going for cardiac cath today.    Filed Vitals:   07/01/12 0203 07/01/12 0500 07/01/12 1102 07/01/12 1327  BP: 154/91 119/71 118/60 120/78  Pulse: 67 60  55  Temp: 97.7 F (36.5 C) 97.5 F (36.4 C)  98.7 F (37.1 C)  TempSrc: Oral Oral    Resp:    20  Height: 6\' 1"  (1.854 m)     Weight: 109.77 kg (242 lb)     SpO2: 100% 99%  95%   No intake or output data in the 24 hours ending 07/01/12 1346  LABS: Basic Metabolic Panel:  Basename 06/30/12 1734  NA 137  K 4.4  CL 99  CO2 26  GLUCOSE 107*  BUN 15  CREATININE 1.22  CALCIUM 10.1  MG --  PHOS --   Liver Function Tests: No results found for this basename: AST:2,ALT:2,ALKPHOS:2,BILITOT:2,PROT:2,ALBUMIN:2 in the last 72 hours No results found for this basename: LIPASE:2,AMYLASE:2 in the last 72 hours CBC:  Basename 07/01/12 0602 06/30/12 1734  WBC 8.2 8.9  NEUTROABS -- --  HGB 13.9 15.6  HCT 41.6 46.3  MCV 88.3 89.2  PLT 185 194   Cardiac Enzymes:  Basename 07/01/12 0602  CKTOTAL --  CKMB --  CKMBINDEX --  TROPONINI <0.30   BNP: No components found with this basename: POCBNP:3 D-Dimer: No results found for this basename: DDIMER:2 in the last 72 hours Hemoglobin A1C:  Basename 07/01/12 0602  HGBA1C 5.9*   Fasting Lipid Panel:  Basename 07/01/12 0602  CHOL 214*  HDL 35*  LDLCALC 144*  TRIG 176*  CHOLHDL 6.1  LDLDIRECT --   Thyroid Function Tests:  Basename 07/01/12 0602  TSH 1.014  T4TOTAL --  T3FREE --  THYROIDAB --   Anemia Panel: No results found for this basename: VITAMINB12,FOLATE,FERRITIN,TIBC,IRON,RETICCTPCT in the last 72 hours   PHYSICAL EXAM General: Well developed, well nourished, in no acute distress HEENT:  Normocephalic and atramatic Neck:  No JVD.  Lungs: Clear bilaterally to auscultation and percussion. Heart: HRRR . Normal S1 and S2 without gallops or murmurs.  Abdomen: Bowel sounds are positive, abdomen soft and  non-tender  Msk:  Back normal, normal gait. Normal strength and tone for age. Extremities: No clubbing, cyanosis or edema.   Neuro: Alert and oriented X 3. Psych:  Good affect, responds appropriately  TELEMETRY: Reviewed telemetry pt in NSR:  ASSESSMENT AND PLAN: 1. Chest pain : possible unstable angina. Symptoms are overall atypical. Cardiac cath today as planned. If no obstructive disease, can likely discharge home.   2. Labile hypertension: BP is well controlled now. Possible anxiety. Evaluate for Pheochromocytoma as outpatient.  It might be helpful to have a PCP locally.   Lorine Bears, MD, [facility] 07/01/2012 1:46 PM

## 2012-07-01 NOTE — Progress Notes (Signed)
   SUBJECTIVE: He is chest pain free. Ruled out for MI. Going for cardiac cath today.    Filed Vitals:   07/01/12 0203 07/01/12 0500 07/01/12 1102 07/01/12 1327  BP: 154/91 119/71 118/60 120/78  Pulse: 67 60  55  Temp: 97.7 F (36.5 C) 97.5 F (36.4 C)  98.7 F (37.1 C)  TempSrc: Oral Oral    Resp:    20  Height: 6' 1" (1.854 m)     Weight: 109.77 kg (242 lb)     SpO2: 100% 99%  95%   No intake or output data in the 24 hours ending 07/01/12 1346  LABS: Basic Metabolic Panel:  Basename 06/30/12 1734  NA 137  K 4.4  CL 99  CO2 26  GLUCOSE 107*  BUN 15  CREATININE 1.22  CALCIUM 10.1  MG --  PHOS --   Liver Function Tests: No results found for this basename: AST:2,ALT:2,ALKPHOS:2,BILITOT:2,PROT:2,ALBUMIN:2 in the last 72 hours No results found for this basename: LIPASE:2,AMYLASE:2 in the last 72 hours CBC:  Basename 07/01/12 0602 06/30/12 1734  WBC 8.2 8.9  NEUTROABS -- --  HGB 13.9 15.6  HCT 41.6 46.3  MCV 88.3 89.2  PLT 185 194   Cardiac Enzymes:  Basename 07/01/12 0602  CKTOTAL --  CKMB --  CKMBINDEX --  TROPONINI <0.30   BNP: No components found with this basename: POCBNP:3 D-Dimer: No results found for this basename: DDIMER:2 in the last 72 hours Hemoglobin A1C:  Basename 07/01/12 0602  HGBA1C 5.9*   Fasting Lipid Panel:  Basename 07/01/12 0602  CHOL 214*  HDL 35*  LDLCALC 144*  TRIG 176*  CHOLHDL 6.1  LDLDIRECT --   Thyroid Function Tests:  Basename 07/01/12 0602  TSH 1.014  T4TOTAL --  T3FREE --  THYROIDAB --   Anemia Panel: No results found for this basename: VITAMINB12,FOLATE,FERRITIN,TIBC,IRON,RETICCTPCT in the last 72 hours   PHYSICAL EXAM General: Well developed, well nourished, in no acute distress HEENT:  Normocephalic and atramatic Neck:  No JVD.  Lungs: Clear bilaterally to auscultation and percussion. Heart: HRRR . Normal S1 and S2 without gallops or murmurs.  Abdomen: Bowel sounds are positive, abdomen soft and  non-tender  Msk:  Back normal, normal gait. Normal strength and tone for age. Extremities: No clubbing, cyanosis or edema.   Neuro: Alert and oriented X 3. Psych:  Good affect, responds appropriately  TELEMETRY: Reviewed telemetry pt in NSR:  ASSESSMENT AND PLAN: 1. Chest pain : possible unstable angina. Symptoms are overall atypical. Cardiac cath today as planned. If no obstructive disease, can likely discharge home.   2. Labile hypertension: BP is well controlled now. Possible anxiety. Evaluate for Pheochromocytoma as outpatient.  It might be helpful to have a PCP locally.   Muhammad Arida, MD, FACC 07/01/2012 1:46 PM     

## 2012-07-01 NOTE — Discharge Summary (Signed)
See rounding note and cath note. cdm

## 2012-07-01 NOTE — CV Procedure (Signed)
   Cardiac Catheterization Operative Report  Dalton Martin [id number] 10/2/20133:14 PM DEFAULT,PROVIDER, MD  Procedure Performed:  1. Left Heart Catheterization 2. Selective Coronary Angiography 3. Left ventricular angiogram  Operator: Verne Carrow, MD  Indication:  Chest pain concerning for unstable angina. Negative cardiac markers.                                      Procedure Details: The risks, benefits, complications, treatment options, and expected outcomes were discussed with the patient. The patient and/or family concurred with the proposed plan, giving informed consent. The patient was brought to the cath lab after IV hydration was begun and oral premedication was given. The patient was further sedated with Versed and Fentanyl. An Allens test was positive on the right wrist. The right wrist was prepped and draped in a sterile fashion. I attempted access into the right radial artery but was unable to engage the radial artery. The right groin was prepped and draped in the usual manner. Using the modified Seldinger access technique, a 5 French sheath was placed in the right femoral artery. Standard diagnostic catheters were used to perform selective coronary angiography. A pigtail catheter was used to perform a left ventricular angiogram.  There were no immediate complications. The patient was taken to the recovery area in stable condition.   Hemodynamic Findings: Central aortic pressure: 116/65 Left ventricular pressure: 126/6/11  Angiographic Findings:  Left main: No obstructive disease.   Left Anterior Descending Artery: Large caliber vessel that courses to the apex. Moderate sized diagonal branch with no disease.   Circumflex Artery: Moderate sized vessel with no disease noted.   Right Coronary Artery: Large caliber, dominant vessel with no disease.   Left Ventricular Angiogram: LVEF 60-65%.   Impression: 1. No angiographic evidence of CAD 2. Normal LV  systolic function 3. Non-cardiac chest pain.   Recommendations: No further cardiac workup. Discharge home after bedrest. He can follow up with his primary care physician.        Complications:  None. The patient tolerated the procedure well.

## 2012-07-01 NOTE — Progress Notes (Signed)
Utilization review complete 

## 2012-12-24 ENCOUNTER — Encounter (HOSPITAL_COMMUNITY): Payer: Self-pay | Admitting: *Deleted

## 2012-12-24 ENCOUNTER — Emergency Department (INDEPENDENT_AMBULATORY_CARE_PROVIDER_SITE_OTHER)
Admission: EM | Admit: 2012-12-24 | Discharge: 2012-12-24 | Disposition: A | Discharge status: Home / Self Care | Payer: BC Managed Care – PPO | Source: Home / Self Care | Attending: Family Medicine | Admitting: Family Medicine

## 2012-12-24 ENCOUNTER — Other Ambulatory Visit: Payer: Self-pay

## 2012-12-24 DIAGNOSIS — J309 Allergic rhinitis, unspecified: Secondary | ICD-10-CM

## 2012-12-24 DIAGNOSIS — I10 Essential (primary) hypertension: Secondary | ICD-10-CM

## 2012-12-24 MED ORDER — FLUTICASONE PROPIONATE 50 MCG/ACT NA SUSP: 2.0000 | Freq: Every day | NASAL | Status: DC

## 2012-12-24 MED ORDER — AMLODIPINE BESYLATE 5 MG PO TABS: 5.0000 mg | ORAL_TABLET | Freq: Every day | ORAL | Status: DC

## 2012-12-24 MED ORDER — CETIRIZINE HCL 10 MG PO TABS: 10.0000 mg | ORAL_TABLET | Freq: Every day | ORAL | Status: DC

## 2012-12-24 NOTE — ED Notes (Signed)
C/o BP spiking up onset yesterday.  It was 210/115.  He took an extra Metoprolol and it came down.  Today at 12 N BP was 210/111 and 204 /110.  He took 20 mg. Lisinopril and 25 mg of Metoprolol and it has come down again.  C/o both feet itching when BP was up.  Denies numbness, tingling or weakness in arms or legs.  C/o nasal passages closing up.  He took Claritin D @ 1700 yesterday.  No headaches.  Usually his BP is 147/89 when he wakes up.  When he takes deep breathes it drops 130/68.

## 2012-12-24 NOTE — ED Notes (Signed)
Advised Dr. Ladon Applebaum & RN of pts history as stated bp  Readings @ home post taking sinus meds. Denied chest pain, weakness .

## 2012-12-25 NOTE — ED Provider Notes (Signed)
History     CSN: [id number]  Arrival date & time 12/24/12  1408   First MD Initiated Contact with Patient 12/24/12 1550      Chief Complaint  Patient presents with  . Hypertension    (Consider location/radiation/quality/duration/timing/severity/associated sxs/prior treatment) HPI Comments: 67 y/o male with h/o HTN here concerned about increase in his blood pressure for the last 2 days. He has been taking claritin-D for several day due to allergy symptoms, last dose yesterday evening. Yesterday BP was 210/111 at home. Patient took an extra metoprolol tab and improved. Today had blood pressure 204/110 and patient took extra metoprolol 25mg  and lisinorpil 20mg  and improved. BP here 139/81. Denies associated chest pain, leg swelling, shortness of breath, headache, visual or balance problems today or on prior days.    Past Medical History  Diagnosis Date  . Hypertension   . Hyperlipidemia   . Prostate cancer     s/p prostatectomy 2011    Past Surgical History  Procedure Laterality Date  . Prostatectomy      Family History  Problem Relation Age of Onset  . Liver disease Mother   . Cancer Father     History  Substance Use Topics  . Smoking status: Never Smoker   . Smokeless tobacco: Not on file  . Alcohol Use: No      Review of Systems  Constitutional: Negative for fever, chills, diaphoresis and fatigue.  HENT: Positive for congestion, rhinorrhea and sneezing.   Respiratory: Negative for cough, chest tightness, shortness of breath and wheezing.   Cardiovascular: Negative for chest pain and leg swelling.  Gastrointestinal: Negative for nausea and vomiting.  Neurological: Negative for dizziness, weakness and headaches.  All other systems reviewed and are negative.    Allergies  Review of patient's allergies indicates no known allergies.  Home Medications   Current Outpatient Rx  Name  Route  Sig  Dispense  Refill  . aspirin 81 MG chewable tablet   Oral   Chew  1 tablet (81 mg total) by mouth daily.   30 tablet   1   . cholecalciferol (VITAMIN D) 1000 UNITS tablet   Oral   Take 500 Units by mouth daily.         . fish oil-omega-3 fatty acids 1000 MG capsule   Oral   Take 1 g by mouth 2 (two) times daily.         Marland Kitchen ibuprofen (ADVIL,MOTRIN) 200 MG tablet   Oral   Take 200 mg by mouth every 6 (six) hours as needed. For pain         . lisinopril (PRINIVIL,ZESTRIL) 20 MG tablet   Oral   Take 20 mg by mouth daily.         . metoprolol succinate (TOPROL-XL) 25 MG 24 hr tablet   Oral   Take 12.5 mg by mouth 2 (two) times daily.         . Potassium 95 MG TABS   Oral   Take 1 tablet by mouth daily.         Marland Kitchen amLODipine (NORVASC) 5 MG tablet   Oral   Take 1 tablet (5 mg total) by mouth daily.   30 tablet   1   . cetirizine (ZYRTEC) 10 MG tablet   Oral   Take 1 tablet (10 mg total) by mouth daily.   30 tablet   0   . fluticasone (FLONASE) 50 MCG/ACT nasal spray   Nasal   Place 2  sprays into the nose daily.   16 g   0     BP 139/81  Pulse 57  Temp(Src) 98.5 F (36.9 C) (Oral)  Resp 16  SpO2 100%  Physical Exam  Nursing note and vitals reviewed. Constitutional: He is oriented to person, place, and time. He appears well-developed and well-nourished. No distress.  HENT:  Head: Normocephalic and atraumatic.  Nasal Congestion with erythema and swelling of nasal turbinates, clear rhinorrhea. No pharyngeal erythema no exudates. No uvula deviation. No trismus. TM's normal.  Eyes: Conjunctivae are normal.  Neck: Neck supple. No JVD present.  Cardiovascular: Normal rate, regular rhythm and normal heart sounds.  Exam reveals no gallop and no friction rub.   No murmur heard. Pulmonary/Chest: Effort normal and breath sounds normal. No respiratory distress. He has no wheezes. He has no rales. He exhibits no tenderness.  Neurological: He is alert and oriented to person, place, and time. He has normal strength. No cranial  nerve deficit or sensory deficit. He displays a negative Romberg sign.  Skin: No rash noted. He is not diaphoretic.    ED Course  Procedures (including critical care time)  Labs Reviewed - No data to display No results found.   1. HTN (hypertension)   2. Rhinitis, allergic    EKG: sinus rithm with a ventricular rate at  58 bpm, no ischemic changes.    MDM  Asked to stop pseudoephedrine containing otc meds.  Asked to recheck BP in am 2 hours after taking daily usual blood pressure medications and start norvasc 5mg  daily if BP >150/90.  Asked to follow up with his PCP next week for blood pressure monitoring and to review medications. Prescribed flonase and loratadine for rhinitis.  Supportive care and red flags that should prompt patients return to medical attention discussed with patient and provided in writing.         Sharin Grave, MD 12/25/12 1359

## 2013-11-09 ENCOUNTER — Other Ambulatory Visit: Payer: Self-pay | Admitting: Dermatology

## 2014-05-19 ENCOUNTER — Other Ambulatory Visit: Payer: Self-pay | Admitting: Urology

## 2014-05-20 ENCOUNTER — Encounter (HOSPITAL_BASED_OUTPATIENT_CLINIC_OR_DEPARTMENT_OTHER): Payer: Self-pay | Admitting: *Deleted

## 2014-05-20 NOTE — Progress Notes (Signed)
NPO AFTER MN.  ARRIVE AT 0715.  NEEDS ISTAT AND EKG.  

## 2014-05-27 ENCOUNTER — Encounter (HOSPITAL_BASED_OUTPATIENT_CLINIC_OR_DEPARTMENT_OTHER): Payer: Medicare HMO | Admitting: Anesthesiology

## 2014-05-27 ENCOUNTER — Encounter (HOSPITAL_BASED_OUTPATIENT_CLINIC_OR_DEPARTMENT_OTHER)
Admission: RE | Disposition: A | Discharge status: Home / Self Care | Payer: Self-pay | Source: Ambulatory Visit | Attending: Urology

## 2014-05-27 ENCOUNTER — Ambulatory Visit (HOSPITAL_BASED_OUTPATIENT_CLINIC_OR_DEPARTMENT_OTHER): Payer: Medicare HMO | Admitting: Anesthesiology

## 2014-05-27 ENCOUNTER — Ambulatory Visit (HOSPITAL_BASED_OUTPATIENT_CLINIC_OR_DEPARTMENT_OTHER)
Admission: RE | Admit: 2014-05-27 | Discharge: 2014-05-27 | Disposition: A | Discharge status: Home / Self Care | Payer: Medicare HMO | Source: Ambulatory Visit | Attending: Urology | Admitting: Urology

## 2014-05-27 ENCOUNTER — Encounter (HOSPITAL_BASED_OUTPATIENT_CLINIC_OR_DEPARTMENT_OTHER): Payer: Self-pay | Admitting: Anesthesiology

## 2014-05-27 DIAGNOSIS — L259 Unspecified contact dermatitis, unspecified cause: Secondary | ICD-10-CM | POA: Insufficient documentation

## 2014-05-27 DIAGNOSIS — N4829 Other inflammatory disorders of penis: Secondary | ICD-10-CM | POA: Diagnosis not present

## 2014-05-27 DIAGNOSIS — Z87891 Personal history of nicotine dependence: Secondary | ICD-10-CM | POA: Diagnosis not present

## 2014-05-27 DIAGNOSIS — N529 Male erectile dysfunction, unspecified: Secondary | ICD-10-CM | POA: Diagnosis not present

## 2014-05-27 DIAGNOSIS — Z79899 Other long term (current) drug therapy: Secondary | ICD-10-CM | POA: Diagnosis not present

## 2014-05-27 DIAGNOSIS — G4730 Sleep apnea, unspecified: Secondary | ICD-10-CM | POA: Diagnosis not present

## 2014-05-27 DIAGNOSIS — I10 Essential (primary) hypertension: Secondary | ICD-10-CM | POA: Insufficient documentation

## 2014-05-27 DIAGNOSIS — Z8546 Personal history of malignant neoplasm of prostate: Secondary | ICD-10-CM | POA: Diagnosis not present

## 2014-05-27 DIAGNOSIS — N489 Disorder of penis, unspecified: Secondary | ICD-10-CM | POA: Diagnosis present

## 2014-05-27 HISTORY — PX: LESION DESTRUCTION: SHX5132

## 2014-05-27 HISTORY — DX: Personal history of malignant neoplasm of prostate: Z85.46

## 2014-05-27 HISTORY — DX: Dependence on other enabling machines and devices: Z99.89

## 2014-05-27 HISTORY — DX: Disorder of penis, unspecified: N48.9

## 2014-05-27 HISTORY — DX: Presence of external hearing-aid: Z97.4

## 2014-05-27 HISTORY — DX: Personal history of urinary calculi: Z87.442

## 2014-05-27 HISTORY — DX: Allergic rhinitis, unspecified: J30.9

## 2014-05-27 HISTORY — DX: Obstructive sleep apnea (adult) (pediatric): G47.33

## 2014-05-27 LAB — POCT I-STAT, CHEM 8
BUN: 17 mg/dL (ref 6–23)
Calcium, Ion: 1.25 mmol/L (ref 1.13–1.30)
Chloride: 104 mEq/L (ref 96–112)
Creatinine, Ser: 0.9 mg/dL (ref 0.50–1.35)
Glucose, Bld: 113 mg/dL — ABNORMAL HIGH (ref 70–99)
HCT: 45 % (ref 39.0–52.0)
Hemoglobin: 15.3 g/dL (ref 13.0–17.0)
Potassium: 4 mEq/L (ref 3.7–5.3)
Sodium: 141 mEq/L (ref 137–147)
TCO2: 21 mmol/L (ref 0–100)

## 2014-05-27 SURGERY — DESTRUCTION, LESION, PENIS
Anesthesia: General | Site: Penis

## 2014-05-27 MED ORDER — DEXAMETHASONE SODIUM PHOSPHATE 4 MG/ML IJ SOLN
INTRAMUSCULAR | Status: DC | PRN
  Administered 2014-05-27: 10 mg via INTRAVENOUS

## 2014-05-27 MED ORDER — LIDOCAINE HCL (CARDIAC) 20 MG/ML IV SOLN
INTRAVENOUS | Status: DC | PRN
  Administered 2014-05-27: 60 mg via INTRAVENOUS

## 2014-05-27 MED ORDER — PROPOFOL 10 MG/ML IV BOLUS
INTRAVENOUS | Status: DC | PRN
  Administered 2014-05-27: 200 mg via INTRAVENOUS

## 2014-05-27 MED ORDER — BACITRACIN-NEOMYCIN-POLYMYXIN OINTMENT TUBE
TOPICAL_OINTMENT | CUTANEOUS | Status: DC | PRN
  Administered 2014-05-27: 1 via TOPICAL

## 2014-05-27 MED ORDER — ONDANSETRON HCL 4 MG/2ML IJ SOLN
INTRAMUSCULAR | Status: DC | PRN
  Administered 2014-05-27: 4 mg via INTRAVENOUS

## 2014-05-27 MED ORDER — FENTANYL CITRATE 0.05 MG/ML IJ SOLN
INTRAMUSCULAR | Status: AC
  Filled 2014-05-27: qty 6

## 2014-05-27 MED ORDER — LACTATED RINGERS IV SOLN
INTRAVENOUS | Status: DC
  Administered 2014-05-27 (×2): via INTRAVENOUS
  Filled 2014-05-27: qty 1000

## 2014-05-27 MED ORDER — KETOROLAC TROMETHAMINE 30 MG/ML IJ SOLN
INTRAMUSCULAR | Status: DC | PRN
  Administered 2014-05-27: 30 mg via INTRAVENOUS

## 2014-05-27 MED ORDER — PROMETHAZINE HCL 25 MG/ML IJ SOLN
6.2500 mg | INTRAMUSCULAR | Status: DC | PRN
  Filled 2014-05-27: qty 1

## 2014-05-27 MED ORDER — LACTATED RINGERS IV SOLN
INTRAVENOUS | Status: DC | PRN
  Administered 2014-05-27: 08:00:00 via INTRAVENOUS

## 2014-05-27 MED ORDER — BUPIVACAINE HCL (PF) 0.25 % IJ SOLN
INTRAMUSCULAR | Status: DC | PRN
  Administered 2014-05-27: 10 mL

## 2014-05-27 MED ORDER — ACETAMINOPHEN 10 MG/ML IV SOLN
INTRAVENOUS | Status: DC | PRN
  Administered 2014-05-27: 1000 mg via INTRAVENOUS

## 2014-05-27 MED ORDER — MEPERIDINE HCL 25 MG/ML IJ SOLN
6.2500 mg | INTRAMUSCULAR | Status: DC | PRN
  Filled 2014-05-27: qty 1

## 2014-05-27 MED ORDER — MIDAZOLAM HCL 2 MG/2ML IJ SOLN
INTRAMUSCULAR | Status: AC
  Filled 2014-05-27: qty 2

## 2014-05-27 MED ORDER — CEFAZOLIN SODIUM-DEXTROSE 2-3 GM-% IV SOLR
2.0000 g | INTRAVENOUS | Status: AC
  Administered 2014-05-27: 2 g via INTRAVENOUS
  Filled 2014-05-27: qty 50

## 2014-05-27 MED ORDER — HYDROCODONE-ACETAMINOPHEN 10-325 MG PO TABS: 1.0000 | ORAL_TABLET | ORAL | Status: DC | PRN

## 2014-05-27 MED ORDER — FENTANYL CITRATE 0.05 MG/ML IJ SOLN
25.0000 ug | INTRAMUSCULAR | Status: DC | PRN
  Filled 2014-05-27: qty 1

## 2014-05-27 MED ORDER — CEFAZOLIN SODIUM-DEXTROSE 2-3 GM-% IV SOLR
INTRAVENOUS | Status: AC
  Filled 2014-05-27: qty 50

## 2014-05-27 MED ORDER — GLYCOPYRROLATE 0.2 MG/ML IJ SOLN
INTRAMUSCULAR | Status: DC | PRN
  Administered 2014-05-27: 0.2 mg via INTRAVENOUS

## 2014-05-27 MED ORDER — FENTANYL CITRATE 0.05 MG/ML IJ SOLN
INTRAMUSCULAR | Status: DC | PRN
  Administered 2014-05-27: 25 ug via INTRAVENOUS
  Administered 2014-05-27: 50 ug via INTRAVENOUS
  Administered 2014-05-27 (×3): 25 ug via INTRAVENOUS

## 2014-05-27 MED ORDER — MIDAZOLAM HCL 5 MG/5ML IJ SOLN
INTRAMUSCULAR | Status: DC | PRN
  Administered 2014-05-27 (×2): 1 mg via INTRAVENOUS

## 2014-05-27 SURGICAL SUPPLY — 44 items
BANDAGE CO FLEX L/F 1IN X 5YD (GAUZE/BANDAGES/DRESSINGS) IMPLANT
BANDAGE CO FLEX L/F 2IN X 5YD (GAUZE/BANDAGES/DRESSINGS) IMPLANT
BLADE CLIPPER SURG (BLADE) IMPLANT
BLADE SURG 15 STRL LF DISP TIS (BLADE) ×1 IMPLANT
BLADE SURG 15 STRL SS (BLADE) ×2
BNDG COHESIVE 1X5 TAN STRL LF (GAUZE/BANDAGES/DRESSINGS) ×1 IMPLANT
CLOTH BEACON ORANGE TIMEOUT ST (SAFETY) ×2 IMPLANT
COVER MAYO STAND STRL (DRAPES) IMPLANT
COVER TABLE BACK 60X90 (DRAPES) IMPLANT
DRAIN PENROSE 18X1/2 LTX STRL (DRAIN) ×1 IMPLANT
DRAPE EENT NEONATAL 1202 (DRAPE) IMPLANT
DRAPE PED LAPAROTOMY (DRAPES) IMPLANT
ELECT NDL TIP 2.8 STRL (NEEDLE) IMPLANT
ELECT NEEDLE TIP 2.8 STRL (NEEDLE) IMPLANT
ELECT REM PT RETURN 9FT ADLT (ELECTROSURGICAL)
ELECT REM PT RETURN 9FT PED (ELECTROSURGICAL)
ELECTRODE REM PT RETRN 9FT PED (ELECTROSURGICAL) IMPLANT
ELECTRODE REM PT RTRN 9FT ADLT (ELECTROSURGICAL) IMPLANT
GLOVE BIO SURGEON STRL SZ8 (GLOVE) ×2 IMPLANT
GLOVE BIOGEL M 6.5 STRL (GLOVE) ×1 IMPLANT
GLOVE BIOGEL PI IND STRL 6.5 (GLOVE) IMPLANT
GLOVE BIOGEL PI IND STRL 7.5 (GLOVE) IMPLANT
GLOVE BIOGEL PI INDICATOR 6.5 (GLOVE) ×1
GLOVE BIOGEL PI INDICATOR 7.5 (GLOVE) ×1
GOWN STRL REUS W/TWL LRG LVL3 (GOWN DISPOSABLE) ×1 IMPLANT
GOWN STRL REUS W/TWL XL LVL3 (GOWN DISPOSABLE) ×1 IMPLANT
GOWN W/COTTON TOWEL STD LRG (GOWNS) ×1 IMPLANT
GOWN XL W/COTTON TOWEL STD (GOWNS) ×1 IMPLANT
IV NS IRRIG 3000ML ARTHROMATIC (IV SOLUTION) IMPLANT
NDL HYPO 25X1 1.5 SAFETY (NEEDLE) IMPLANT
NEEDLE HYPO 22GX1.5 SAFETY (NEEDLE) ×1 IMPLANT
NEEDLE HYPO 25X1 1.5 SAFETY (NEEDLE) IMPLANT
NS IRRIG 500ML POUR BTL (IV SOLUTION) ×3 IMPLANT
PACK BASIN DAY SURGERY FS (CUSTOM PROCEDURE TRAY) ×2 IMPLANT
PENCIL BUTTON HOLSTER BLD 10FT (ELECTRODE) ×2 IMPLANT
SPONGE GAUZE 4X4 12PLY STER LF (GAUZE/BANDAGES/DRESSINGS) ×1 IMPLANT
SUT CHROMIC 4 0 PS 2 18 (SUTURE) ×2 IMPLANT
SUT CHROMIC 4 0 RB 1X27 (SUTURE) ×2 IMPLANT
SUT CHROMIC 5 0 P 3 (SUTURE) ×4 IMPLANT
SUT CHROMIC 5 0 RB 1 27 (SUTURE) ×1 IMPLANT
SYR CONTROL 10ML LL (SYRINGE) IMPLANT
TOWEL OR 17X24 6PK STRL BLUE (TOWEL DISPOSABLE) ×4 IMPLANT
WATER STERILE IRR 3000ML UROMA (IV SOLUTION) IMPLANT
WATER STERILE IRR 500ML POUR (IV SOLUTION) ×2 IMPLANT

## 2014-05-27 NOTE — Discharge Instructions (Addendum)
Postoperative instructions for penile surgery  Wound:  In most cases your incision will have absorbable sutures that run along the course of your incision and will dissolve within the first 10-20 days. Some will fall out even earlier. Expect some redness as the sutures dissolved but this should occur only around the sutures. If there is generalized redness, especially with increasing pain or swelling, let us know. The penis could get "black and blue" as the blood in the tissues spread. Sometimes the whole penis will turn colors. The black and blue is followed by a yellow and brown color. In time, all the discoloration will go away.  Diet:  You may return to your normal diet within 24 hours following your surgery. You may note some mild nausea and possibly vomiting the first 6-8 hours following surgery. This is usually due to the side effects of anesthesia, and will disappear quite soon. I would suggest clear liquids and a very light meal the first evening following your surgery.  Activity:  Your physical activity should be restricted the first 48 hours. During that time you should remain relatively inactive, moving about only when necessary. During the first 7-10 days following surgery he should avoid lifting any heavy objects (anything greater than 15 pounds), and avoid strenuous exercise. If you work, ask [place] specifically about your restrictions, both for work and home. We will write a note to your employer if needed.  Ice packs can be placed on and off over the penis for the first 48 hours to help relieve the pain and keep the swelling down. Frozen peas or corn in a ZipLock bag can be frozen, used and re-frozen. Fifteen minutes on and 15 minutes off is a reasonable schedule.   Hygiene:  You may shower 48 hours after your surgery. Tub bathing should be restricted until the seventh day.  Medication:  You will be sent home with some type of pain medication. In many cases you will be sent home  with a narcotic pain pill (Vicodin or Tylox). If the pain is not too bad, you may take either Tylenol (acetaminophen) or Advil (ibuprofen) which contain no narcotic agents, and might be tolerated a little better, with fewer side effects. If the pain medication you are sent home with does not control the pain, you will have to let us know. Some narcotic pain medications cannot be given or refilled by a phone call to a pharmacy.  Problems you should report to [place]:   Fever of 101.0 degrees Fahrenheit or greater.  Moderate or severe swelling under the skin incision or involving the scrotum. Drug reaction such as hives, a rash, nausea or vomiting.  Post Anesthesia Home Care Instructions  Activity: Get plenty of rest for the remainder of the day. A responsible adult should stay with you for 24 hours following the procedure.  For the next 24 hours, DO NOT: -Drive a car -Paediatric nurse -Drink alcoholic beverages -Take any medication unless instructed by your physician -Make any legal decisions or sign important papers.  Meals: Start with liquid foods such as gelatin or soup. Progress to regular foods as tolerated. Avoid greasy, spicy, heavy foods. If nausea and/or vomiting occur, drink only clear liquids until the nausea and/or vomiting subsides. Call your physician if vomiting continues.  Special Instructions/Symptoms: Your throat may feel dry or sore from the anesthesia or the breathing tube placed in your throat during surgery. If this causes discomfort, gargle with warm salt water. The discomfort should disappear within 24  hours.

## 2014-05-27 NOTE — Anesthesia Procedure Notes (Signed)
Procedure Name: LMA Insertion Date/Time: 05/27/2014 8:52 AM Performed by: Justice Rocher Pre-anesthesia Checklist: Patient identified, Emergency Drugs available, Suction available and Patient being monitored Patient Re-evaluated:Patient Re-evaluated prior to inductionOxygen Delivery Method: Circle System Utilized Preoxygenation: Pre-oxygenation with 100% oxygen Intubation Type: IV induction Ventilation: Mask ventilation without difficulty LMA: LMA inserted LMA Size: 5.0 Number of attempts: 1 Airway Equipment and Method: bite block Placement Confirmation: positive ETCO2 Tube secured with: Tape Dental Injury: Teeth and Oropharynx as per pre-operative assessment

## 2014-05-27 NOTE — Op Note (Signed)
PATIENT:  Dalton Martin  PRE-OPERATIVE DIAGNOSIS:  Penile lesions  POST-OPERATIVE DIAGNOSIS: Same  PROCEDURE: Excision of penile lesions (2 lesions, 8 mm)  SURGEON:  Claybon Jabs  INDICATION: Dalton Martin is a 68 year old male who noted 2 areas on his glans penis but did not know how long they have been present. It did not injure cause any discomfort. The areas were approximately 6 mm and 2 mm located on the right and left anterior glans and were slightly raised, slightly less pigmented than the surrounding epithelium. The concern is for possible neoplasm and therefore we discussed surgical excision. He presents today for his excisional biopsies.  ANESTHESIA:  General  EBL:  Minimal  DRAINS: None  LOCAL MEDICATIONS USED:  1/4% plain Marcaine  SPECIMEN:  The larger lesion from the right anterior glans was sent as specimen #1 and the smaller lesion from the left side of the glans was sent as specimen #2.  Description of procedure: After informed consent the patient was taken to the operating room and placed on the table in a supine position. General anesthesia was then administered. Once fully anesthetized the genitalia were sterilely prepped and draped in standard fashion. An official timeout was then performed.  I first performed a dorsal penile block in the standard fashion. I then placed a half-inch Penrose around the base of the penis as a penile tourniquet. I then excised the larger lesion from the glans with a scalpel. An ellipse of tissue was excised. The smaller lesion was excised in a similar fashion. I then closed both of the incisions with interrupted 5-0 chromic suture. The tourniquet was then removed. No bleeding was noted. Neosporin was applied to both incisions and gauze was gently wrapped around the glans and secured with Coban. The patient tolerated procedure well and there were no intraoperative complications.   PLAN OF CARE: Discharge to home after PACU  PATIENT  DISPOSITION:  PACU - hemodynamically stable.

## 2014-05-27 NOTE — H&P (Signed)
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 His final pathology was Gleason score 9 (4+5) on both sides of the prostate. He was pathologic stage pTx, pN0, pMx. There was a question of a positive surgical margin do to a disrupted prostatic capsule although clinically he was not felt to of had extraprostatic disease.      Stress urinary incontinence: His incontinence was fairly minimal initially but now has completely resolved.    Organic erectile dysfunction: He has tried Cialis for his erectile dysfunction which he reported was effective for him initially however he got to the point where he was using intracavernosal injections and tried 10, 20 and 30 mcg and still was unable to achieve a satisfactory erection.  Current therapy: Sildenafil      Interval history: The areas on his penis were just identified recently said he does not know how long they've been present. They do not itch nor do they cause any discomfort. They have not bled.   Past Medical History Problems  1. History of Benign prostatic hyperplasia with urinary obstruction (600.01,599.69) 2. History of hypercholesterolemia (V12.29) 3. History of hypertension (V12.59) 4. History of kidney stones (V13.01) 5. History of Male stress incontinence (788.32) 6. History of Muscle weakness (728.87) 7. Personal history of prostate cancer (V10.46)  Surgical History Problems  1. History of Biopsy Of The Prostate Needle 2. History of Hernia Repair 3. History of Prostatectomy Robotic-Assisted     Current Meds 1. Lisinopril 40 MG Oral Tablet;  Therapy: [id number] to Recorded 2. Metoprolol Tartrate 25 MG Oral Tablet;  Therapy: (Recorded:25Mar2014) to Recorded 3. Sildenafil Citrate 20 MG Oral Tablet; TAKE 5 TABLET As needed;  Therapy: [id number] to (Last Rx:25Mar2014) Ordered  Allergies Medication  1. No Known Drug  Allergies  Family History Problems  1. Family history of Death : Brother   MI age 66 2. Family history of Death In The Family Father : Father   age 10; ? 27. Family history of Death In The Family Mother : Mother   age 85; colon cancer 58. Family history of Family Health Status Number Of Children   2 sons; 1 daughter  Social History Problems  1. Activities Of Daily Living 2. Alcohol Use   occ. 3. Caffeine Use   2-3 a day 4. Exercise Habits   Is very active at work but does not participate in any regular structured exercise program.      He is independent in all activities of daily living and light source. 5. Former smoker [place])   Smoked 1/2 ppd for 3 yearsQuit 1976 6. Living Independently With Spouse 7. Marital History - Currently Married 8. Occupation:   Dealer 9. Self-reliant In Usual Daily Activities  Review of Systems Genitourinary, constitutional, skin, eye, otolaryngeal, hematologic/lymphatic, cardiovascular, pulmonary, endocrine, musculoskeletal, gastrointestinal, neurological and psychiatric system(s) were reviewed and pertinent findings if present are noted other than as above.    Vitals Vital Signs  Blood Pressure  Blood Pressure: 123 / 80 Temperature  Temperature: 97.2 F Pulse  Heart Rate: 64  Physical Exam Constitutional: Well nourished and well developed. No acute distress.  ENT:. The ears and nose are normal in appearance.  Neck: The appearance of the neck is normal and no neck mass is present.  Pulmonary: No respiratory distress and normal respiratory rhythm and effort.  Cardiovascular: Heart rate and rhythm are normal. No peripheral edema.  Abdomen: The abdomen is  soft and nontender. No masses are palpated. No CVA tenderness. No hernias are palpable. No hepatosplenomegaly noted.  Rectal: Rectal exam demonstrates normal sphincter tone, no tenderness and no masses. Estimated prostate size is 1+. The prostate has no nodularity and is not  tender. The left seminal vesicle is nonpalpable. The right seminal vesicle is nonpalpable. The perineum is normal on inspection.  Genitourinary: He is circumcised and has 2 lesions on his glans. The largest is 6 mm and is slightly raised, smooth, round and slightly firmer than the surrounding epithelium. It is located on the glans at about the 11 o'clock position and a second smaller lesion about 2?3 millimeters in size is located on the anterior surface of the glans on the left-hand side.  Lymphatics: The femoral and inguinal nodes are not enlarged or tender.  Skin: Normal skin turgor, no visible rash and no visible skin lesions.  Neuro/Psych:. Mood and affect are appropriate.      Results/Data Urine  COLOR YELLOW  APPEARANCE CLEAR  SPECIFIC GRAVITY 1.010  pH 7.0  GLUCOSE NEG mg/dL BILIRUBIN NEG  KETONE NEG mg/dL BLOOD NEG  PROTEIN NEG mg/dL UROBILINOGEN 0.2 mg/dL NITRITE NEG  LEUKOCYTE ESTERASE NEG   The following clinical lab reports were reviewed:  PSA.    Assessment His penile lesion appears to be possibly a low-grade malignant lesion or possibly a premalignant lesion. The largest measures approximately 6 mm and the smaller area is about 2-3 millimeters in size. They are located on the glans. I told the patient that it also could be benign but clearly needs to be removed. I therefore have discussed performing an excision of the lesions under general anesthesia as an outpatient. We went over the procedure in detail including its risks and complications, the alternatives, the probability of success and the anticipated postoperative course. He understands and has elected to proceed.   Plan He will be scheduled for excisional biopsy of the 2 penile lesions.

## 2014-05-27 NOTE — Anesthesia Postprocedure Evaluation (Signed)
  Anesthesia Post-op Note  Patient: Dalton Martin  Procedure(s) Performed: Procedure(s) (LRB): EXCISION OF PENIS LESIONS (N/A)  Patient Location: PACU  Anesthesia Type: General  Level of Consciousness: awake and alert   Airway and Oxygen Therapy: Patient Spontanous Breathing  Post-op Pain: mild  Post-op Assessment: Post-op Vital signs reviewed, Patient's Cardiovascular Status Stable, Respiratory Function Stable, Patent Airway and No signs of Nausea or vomiting  Last Vitals:  Filed Vitals:   05/27/14 1100  BP: 113/72  Pulse: 55  Temp:   Resp: 20    Post-op Vital Signs: stable   Complications: No apparent anesthesia complications

## 2014-05-27 NOTE — Anesthesia Preprocedure Evaluation (Addendum)
Anesthesia Evaluation  Patient identified by MRN, date of birth, ID band Patient awake    Reviewed: Allergy & Precautions, H&P , NPO status , Patient's Chart, lab work & pertinent test results  Airway Mallampati: II TM Distance: >3 FB Neck ROM: Full    Dental no notable dental hx.    Pulmonary neg pulmonary ROS, sleep apnea and Continuous Positive Airway Pressure Ventilation , former smoker,  breath sounds clear to auscultation  Pulmonary exam normal       Cardiovascular hypertension, Pt. on medications negative cardio ROS  Rhythm:Regular Rate:Normal     Neuro/Psych negative neurological ROS  negative psych ROS   GI/Hepatic negative GI ROS, Neg liver ROS,   Endo/Other  negative endocrine ROS  Renal/GU negative Renal ROS  negative genitourinary   Musculoskeletal negative musculoskeletal ROS (+)   Abdominal   Peds negative pediatric ROS (+)  Hematology negative hematology ROS (+)   Anesthesia Other Findings   Reproductive/Obstetrics negative OB ROS                          Anesthesia Physical Anesthesia Plan  ASA: II  Anesthesia Plan: General   Post-op Pain Management:    Induction: Intravenous  Airway Management Planned: LMA  Additional Equipment:   Intra-op Plan:   Post-operative Plan: Extubation in OR  Informed Consent: I have reviewed the patients History and Physical, chart, labs and discussed the procedure including the risks, benefits and alternatives for the proposed anesthesia with the patient or authorized representative who has indicated his/her understanding and acceptance.   Dental advisory given  Plan Discussed with: CRNA  Anesthesia Plan Comments:         Anesthesia Quick Evaluation

## 2014-05-27 NOTE — Transfer of Care (Signed)
Immediate Anesthesia Transfer of Care Note  Patient: Dalton Martin  Procedure(s) Performed: Procedure(s) (LRB): EXCISION OF PENIS LESIONS (N/A)  Patient Location: PACU  Anesthesia Type: General  Level of Consciousness: awake, sedated, patient cooperative and responds to stimulation  Airway & Oxygen Therapy: Patient Spontanous Breathing and Patient connected to face mask oxygen  Post-op Assessment: Report given to PACU RN, Post -op Vital signs reviewed and stable and Patient moving all extremities  Post vital signs: Reviewed and stable  Complications: No apparent anesthesia complications

## 2014-05-30 ENCOUNTER — Encounter (HOSPITAL_BASED_OUTPATIENT_CLINIC_OR_DEPARTMENT_OTHER): Payer: Self-pay | Admitting: Urology

## 2014-07-15 ENCOUNTER — Other Ambulatory Visit: Payer: Self-pay

## 2014-09-08 ENCOUNTER — Encounter (HOSPITAL_COMMUNITY): Payer: Self-pay | Admitting: Cardiovascular Disease

## 2015-02-01 ENCOUNTER — Encounter (HOSPITAL_COMMUNITY): Payer: Self-pay

## 2015-02-01 ENCOUNTER — Emergency Department (INDEPENDENT_AMBULATORY_CARE_PROVIDER_SITE_OTHER)
Admission: EM | Admit: 2015-02-01 | Discharge: 2015-02-01 | Disposition: A | Discharge status: Home / Self Care | Payer: Medicare PPO | Source: Home / Self Care | Attending: Family Medicine | Admitting: Family Medicine

## 2015-02-01 DIAGNOSIS — W57XXXA Bitten or stung by nonvenomous insect and other nonvenomous arthropods, initial encounter: Secondary | ICD-10-CM | POA: Diagnosis not present

## 2015-02-01 DIAGNOSIS — T148 Other injury of unspecified body region: Secondary | ICD-10-CM | POA: Diagnosis not present

## 2015-02-01 DIAGNOSIS — R5383 Other fatigue: Secondary | ICD-10-CM

## 2015-02-01 DIAGNOSIS — R202 Paresthesia of skin: Secondary | ICD-10-CM

## 2015-02-01 NOTE — ED Provider Notes (Signed)
Dalton Martin is a 69 y.o. male who presents to Urgent Care today for multiple complaints.  1) But bite on right neck. This has been present for around 3 days. He states that it is improving. He is applied ice which seems to help. No fevers or chills nausea vomiting or diarrhea. Patient denies any tick exposure. 2) additionally patient notes fatigue. This is been present starting today. He did not sleep much last night and thinks it might be related. Again no fevers chills. No chest pain palpitations or sweats. 3) right fourth digit of the upper extremity numbness starting today. No other part of the hand is numb. He notes the numbness is not absolute but different compared to the other hand. No weakness. No injury. He feels well otherwise.   Past Medical History  Diagnosis Date  . Hypertension   . Hyperlipidemia   . History of prostate cancer     s/p  radical prostatectomy 2011  . Allergic rhinitis   . Penile lesion   . History of kidney stones   . Wears hearing aid     BILATERAL  . OSA on CPAP     MILD OSA PER STUDY 2013   Past Surgical History  Procedure Laterality Date  . Robot assisted laparoscopic radical prostatectomy  11-15-2009    right nerve spare/  bilateral pelvic lymphadenectomy  . Cardiac catheterization  07-01-2012  dr Angelena Form    normal coronary arteries/   ef  60-65%  . Inguinal hernia repair Bilateral as teen  . Lesion destruction N/A 05/27/2014    Procedure: EXCISION OF PENIS LESIONS;  Surgeon: Claybon Jabs, MD;  Location: [facility];  Service: Urology;  Laterality: N/A;  . Left heart catheterization with coronary angiogram N/A 07/01/2012    Procedure: LEFT HEART CATHETERIZATION WITH CORONARY ANGIOGRAM;  Surgeon: Wellington Hampshire, MD;  Location: [facility];  Service: Cardiovascular;  Laterality: N/A;   History  Substance Use Topics  . Smoking status: Former Smoker -- 1.00 packs/day for 4 years    Types: Cigarettes    Quit date: 05/20/1973  .  Smokeless tobacco: Never Used  . Alcohol Use: 2.0 oz/week    4 drink(s) per week     Comment: weekends   ROS as above Medications: No current facility-administered medications for this encounter.   Current Outpatient Prescriptions  Medication Sig Dispense Refill  . amLODipine (NORVASC) 5 MG tablet Take 5 mg by mouth every evening.    Marland Kitchen aspirin EC 81 MG tablet Take 81 mg by mouth daily.    Marland Kitchen lisinopril (PRINIVIL,ZESTRIL) 40 MG tablet Take 40 mg by mouth every morning.    . cetirizine (ZYRTEC) 10 MG tablet Take 10 mg by mouth daily as needed for allergies.    . cholecalciferol (VITAMIN D) 1000 UNITS tablet Take 1,000 Units by mouth daily.     Marland Kitchen ibuprofen (ADVIL,MOTRIN) 800 MG tablet Take 800 mg by mouth every 8 (eight) hours as needed for mild pain.    . Magnesium 500 MG CAPS Take 1 capsule by mouth daily.    . Multiple Vitamins-Minerals (MENS MULTIVITAMIN PLUS PO) Take 1 tablet by mouth daily.    . Potassium 95 MG TABS Take 1 tablet by mouth daily.     No Known Allergies   Exam:  BP 160/88 mmHg  Pulse 73  Temp(Src) 98.1 F (36.7 C) (Oral)  Resp 14  SpO2 96% Gen: Well NAD HEENT: EOMI,  MMM Lungs: Normal work of breathing.  CTABL Heart: RRR no MRG Abd: NABS, Soft. Nondistended, Nontender Exts: Brisk capillary refill, warm and well perfused.  Skin: Erythematous nontender patch on right neck. Neck: Nontender to midline normal neck range of motion negative Spurling's test. Upper extremity strength and sensation is intact throughout bilaterally. Hand normal-appearing nontender normal motion and strength of the hands bilaterally. Patient has mild decreased sensation to the dorsum and palmar aspect of the right fourth digit. Negative Tinel's at the cubital and carpal tunnels  No results found for this or any previous visit (from the past 24 hour(s)). No results found.  Assessment and Plan: 69 y.o. male with  1) bug bite: Treat with hydrocortisone cream 2) fatigue: Due to not  getting enough sleep. Encourage rest and follow-up with PCP if not improved 3) paresthesia of the right fourth digit. Possible digital nerve injury or issue. Watchful waiting follow-up with hand surgery if not better.  Discussed warning signs or symptoms. Please see discharge instructions. Patient expresses understanding.     Gregor Hams, MD 02/01/15 [phone]

## 2015-02-01 NOTE — Discharge Instructions (Signed)
Thank you for coming in today. Follow-up with your primary care provider. Apply hydrocortisone cream to the bug bite. Get some rest and take some Tylenol for fatigue. Follow-up with the hand doctor if your finger numbness does not improve.  \Insect Bite Mosquitoes, flies, fleas, bedbugs, and many other insects can bite. Insect bites are different from insect stings. A sting is when venom is injected into the skin. Some insect bites can transmit infectious diseases. SYMPTOMS  Insect bites usually turn red, swell, and itch for 2 to 4 days. They often go away on their own. TREATMENT  Your caregiver may prescribe antibiotic medicines if a bacterial infection develops in the bite. HOME CARE INSTRUCTIONS  Do not scratch the bite area.  Keep the bite area clean and dry. Wash the bite area thoroughly with soap and water.  Put ice or cool compresses on the bite area.  Put ice in a plastic bag.  Place a towel between your skin and the bag.  Leave the ice on for 20 minutes, 4 times a day for the first 2 to 3 days, or as directed.  You may apply a baking soda paste, cortisone cream, or calamine lotion to the bite area as directed by your caregiver. This can help reduce itching and swelling.  Only take over-the-counter or prescription medicines as directed by your caregiver.  If you are given antibiotics, take them as directed. Finish them even if you start to feel better. You may need a tetanus shot if:  You cannot remember when you had your last tetanus shot.  You have never had a tetanus shot.  The injury broke your skin. If you get a tetanus shot, your arm may swell, get red, and feel warm to the touch. This is common and not a problem. If you need a tetanus shot and you choose not to have one, there is a rare chance of getting tetanus. Sickness from tetanus can be serious. SEEK IMMEDIATE MEDICAL CARE IF:   You have increased pain, redness, or swelling in the bite area.  You see a  red line on the skin coming from the bite.  You have a fever.  You have joint pain.  You have a headache or neck pain.  You have unusual weakness.  You have a rash.  You have chest pain or shortness of breath.  You have abdominal pain, nausea, or vomiting.  You feel unusually tired or sleepy. MAKE SURE YOU:   Understand these instructions.  Will watch your condition.  Will get help right away if you are not doing well or get worse. Document Released: 10/24/2004 Document Revised: 12/09/2011 Document Reviewed: 04/17/2011 [facility] Patient Information 2015 [place], [place]. This information is not intended to replace advice given to you by your health care provider. Make sure you discuss any questions you have with your health care provider.

## 2015-02-01 NOTE — ED Notes (Signed)
C/o not feeling well past few days. Concerned about 1 finger feeling numb and an insect bite on neck . NAD

## 2015-03-27 ENCOUNTER — Other Ambulatory Visit: Payer: Self-pay

## 2015-12-19 ENCOUNTER — Ambulatory Visit (INDEPENDENT_AMBULATORY_CARE_PROVIDER_SITE_OTHER): Payer: Medicare Other | Admitting: Family Medicine

## 2015-12-19 VITALS — BP 128/80 | HR 72 | Temp 98.1°F | Resp 16 | Ht 73.0 in | Wt 241.0 lb

## 2015-12-19 DIAGNOSIS — Z8546 Personal history of malignant neoplasm of prostate: Secondary | ICD-10-CM | POA: Diagnosis not present

## 2015-12-19 DIAGNOSIS — Z6831 Body mass index (BMI) 31.0-31.9, adult: Secondary | ICD-10-CM

## 2015-12-19 DIAGNOSIS — I10 Essential (primary) hypertension: Secondary | ICD-10-CM

## 2015-12-19 LAB — CBC
HCT: 46.4 % (ref 39.0–52.0)
Hemoglobin: 15.6 g/dL (ref 13.0–17.0)
MCH: 30 pg (ref 26.0–34.0)
MCHC: 33.6 g/dL (ref 30.0–36.0)
MCV: 89.2 fL (ref 78.0–100.0)
MPV: 11.5 fL (ref 8.6–12.4)
PLATELETS: 244 10*3/uL (ref 150–400)
RBC: 5.2 MIL/uL (ref 4.22–5.81)
RDW: 14.6 % (ref 11.5–15.5)
WBC: 7.5 10*3/uL (ref 4.0–10.5)

## 2015-12-19 LAB — COMPLETE METABOLIC PANEL WITH GFR
ALBUMIN: 4.6 g/dL (ref 3.6–5.1)
ALK PHOS: 68 U/L (ref 40–115)
ALT: 15 U/L (ref 9–46)
AST: 15 U/L (ref 10–35)
BUN: 12 mg/dL (ref 7–25)
CALCIUM: 9.7 mg/dL (ref 8.6–10.3)
CO2: 22 mmol/L (ref 20–31)
Chloride: 101 mmol/L (ref 98–110)
Creat: 1.06 mg/dL (ref 0.70–1.25)
GFR, EST NON AFRICAN AMERICAN: 72 mL/min (ref >=60)
GFR, Est African American: 84 mL/min (ref >=60)
Glucose, Bld: 113 mg/dL — ABNORMAL HIGH (ref 65–99)
POTASSIUM: 4.9 mmol/L (ref 3.5–5.3)
SODIUM: 136 mmol/L (ref 135–146)
Total Bilirubin: 0.6 mg/dL (ref 0.2–1.2)
Total Protein: 7.4 g/dL (ref 6.1–8.1)

## 2015-12-19 LAB — LIPID PANEL
CHOLESTEROL: 215 mg/dL — AB (ref 125–200)
HDL: 45 mg/dL (ref >=40)
LDL Cholesterol: 137 mg/dL — ABNORMAL HIGH (ref <130)
TRIGLYCERIDES: 167 mg/dL — AB (ref <150)
Total CHOL/HDL Ratio: 4.8 Ratio (ref <=5.0)
VLDL: 33 mg/dL — ABNORMAL HIGH (ref <30)

## 2015-12-19 LAB — TSH: TSH: 0.53 m[IU]/L (ref 0.40–4.50)

## 2015-12-19 MED ORDER — AMLODIPINE BESYLATE 10 MG PO TABS: 10.0000 mg | ORAL_TABLET | Freq: Every evening | ORAL | Status: DC

## 2015-12-19 MED ORDER — LISINOPRIL 40 MG PO TABS: 40.0000 mg | ORAL_TABLET | Freq: Every morning | ORAL | Status: DC

## 2015-12-19 NOTE — Progress Notes (Signed)
Subjective:    Patient ID: Dalton Martin, male    DOB: 08-11-1946, 70 y.o.   MRN: [id number]  HPI This is a 70 yo male who presents today with concerns for elevated blood pressure. He has most recently been cared for at [facility] and is on lisinopril 40 mg and amlodipine 5 mg. He checks his blood pressure at home and has been getting elevated readings of [id number] systolic after activities. He has increased his lisinopril when his pressure is high or if he has headaches or feels dizzy.   He has felt some fullness in his forearm veins and is concerned about clots. No pain,no redness.   Had last CPE about 1 year ago. Had colonoscopy about 3 years ago. 10 year recall.   Was released from urology follow up and instructed to have PSA follow up with primary care.   Past Medical History  Diagnosis Date  . Hypertension   . Hyperlipidemia   . History of prostate cancer     s/p  radical prostatectomy 2011  . Allergic rhinitis   . Penile lesion   . History of kidney stones   . Wears hearing aid     BILATERAL  . OSA on CPAP     MILD OSA PER STUDY 2013   Past Surgical History  Procedure Laterality Date  . Robot assisted laparoscopic radical prostatectomy  11-15-2009    right nerve spare/  bilateral pelvic lymphadenectomy  . Cardiac catheterization  07-01-2012  dr Angelena Form    normal coronary arteries/   ef  60-65%  . Inguinal hernia repair Bilateral as teen  . Lesion destruction N/A 05/27/2014    Procedure: EXCISION OF PENIS LESIONS;  Surgeon: Claybon Jabs, MD;  Location: [facility];  Service: Urology;  Laterality: N/A;  . Left heart catheterization with coronary angiogram N/A 07/01/2012    Procedure: LEFT HEART CATHETERIZATION WITH CORONARY ANGIOGRAM;  Surgeon: Wellington Hampshire, MD;  Location: [facility];  Service: Cardiovascular;  Laterality: N/A;   Family History  Problem Relation Age of Onset  . Liver disease Mother   . Cancer Father    Social History    Substance Use Topics  . Smoking status: Former Smoker -- 1.00 packs/day for 4 years    Types: Cigarettes    Quit date: 05/20/1973  . Smokeless tobacco: Never Used  . Alcohol Use: 2.0 oz/week    4 drink(s) per week     Comment: weekends    Review of Systems No chest pain, no SOB, no edema, occasional headache, no visual changes    Objective:   Physical Exam Physical Exam  Constitutional: Oriented to person, place, and time. He appears well-developed and well-nourished.  HENT:  Head: Normocephalic and atraumatic.  Eyes: Conjunctivae are normal.  Neck: Normal range of motion. Neck supple.  Cardiovascular: Normal rate, regular rhythm and normal heart sounds.   Pulmonary/Chest: Effort normal and breath sounds normal.  Musculoskeletal: Normal range of motion.  Neurological: Alert and oriented to person, place, and time.  Skin: Skin is warm and dry. Right forearm with several small varicosities. No redness, nontender, strong radial pulse, brisk cap refill.  Psychiatric: Normal mood and affect. Behavior is normal. Judgment and thought content normal.  Vitals reviewed.  BP 128/80 mmHg  Pulse 72  Temp(Src) 98.1 F (36.7 C) (Oral)  Resp 16  Ht 6\' 1"  (1.854 m)  Wt 241 lb (109.317 kg)  BMI 31.80 kg/m2  SpO2 98% Wt Readings  from Last 3 Encounters:  12/19/15 241 lb (109.317 kg)  05/27/14 247 lb (112.038 kg)  07/01/12 242 lb (109.77 kg)       Assessment & Plan:  1. Essential hypertension - increase amlodipine to 10 mg - DASH diet discussed and written information provided - monitor BP at home and record readings to bring to clinic - CBC - TSH - COMPLETE METABOLIC PANEL WITH GFR - Hemoglobin A1c - Lipid panel - lisinopril (PRINIVIL,ZESTRIL) 40 MG tablet; Take 1 tablet (40 mg total) by mouth every morning.  Dispense: 90 tablet; Refill: 0 - amLODipine (NORVASC) 10 MG tablet; Take 1 tablet (10 mg total) by mouth every evening.  Dispense: 90 tablet; Refill: 0  2. History of  prostate cancer - PSA  3. BMI 31.0-31.9,adult - CBC - TSH - COMPLETE METABOLIC PANEL WITH GFR - Hemoglobin A1c - Lipid panel  - follow up in 1 month  Clarene Reamer, FNP-BC  [facility] [place] [facility]  12/22/2015 1:45 PM

## 2015-12-19 NOTE — Patient Instructions (Addendum)
Please keep a log of your blood pressures and bring to clinic with you Come back in 1 month for a recheck [place] stands for "Dietary Approaches to Stop Hypertension." The DASH eating plan is a healthy eating plan that has been shown to reduce high blood pressure (hypertension). Additional health benefits may include reducing the risk of type 2 diabetes mellitus, heart disease, and stroke. The DASH eating plan may also help with weight loss. WHAT DO I NEED TO KNOW ABOUT THE DASH EATING PLAN? For the DASH eating plan, you will follow these general guidelines:  Choose foods with a percent daily value for sodium of less than 5% (as listed on the food label).  Use salt-free seasonings or herbs instead of table salt or sea salt.  Check with your health care provider or pharmacist before using salt substitutes.  Eat lower-sodium products, often labeled as "lower sodium" or "no salt added."  Eat fresh foods.  Eat more vegetables, fruits, and low-fat dairy products.  Choose whole grains. Look for the word "whole" as the first word in the ingredient list.  Choose fish and skinless chicken or [place] more often than red meat. Limit fish, poultry, and meat to 6 oz (170 g) each day.  Limit sweets, desserts, sugars, and sugary drinks.  Choose heart-healthy fats.  Limit cheese to 1 oz (28 g) per day.  Eat more home-cooked food and less restaurant, buffet, and fast food.  Limit fried foods.  Cook foods using methods other than frying.  Limit canned vegetables. If you do use them, rinse them well to decrease the sodium.  When eating at a restaurant, ask that your food be prepared with less salt, or no salt if possible. WHAT FOODS CAN I EAT? Seek help from a dietitian for individual calorie needs. Grains Whole grain or whole wheat bread. Brown rice. Whole grain or whole wheat pasta. Quinoa, bulgur, and whole grain cereals. Low-sodium cereals. Corn or whole wheat flour tortillas.  Whole grain cornbread. Whole grain crackers. Low-sodium crackers. Vegetables Fresh or frozen vegetables (raw, steamed, roasted, or grilled). Low-sodium or reduced-sodium tomato and vegetable juices. Low-sodium or reduced-sodium tomato sauce and paste. Low-sodium or reduced-sodium canned vegetables.  Fruits All fresh, canned (in natural juice), or frozen fruits. Meat and Other Protein Products Ground beef (85% or leaner), grass-fed beef, or beef trimmed of fat. Skinless chicken or [place]. Ground chicken or [place]. Pork trimmed of fat. All fish and seafood. Eggs. Dried beans, peas, or lentils. Unsalted nuts and seeds. Unsalted canned beans. Dairy Low-fat dairy products, such as skim or 1% milk, 2% or reduced-fat cheeses, low-fat ricotta or cottage cheese, or plain low-fat yogurt. Low-sodium or reduced-sodium cheeses. Fats and Oils Tub margarines without trans fats. Light or reduced-fat mayonnaise and salad dressings (reduced sodium). Avocado. Safflower, olive, or canola oils. Natural peanut or almond butter. Other Unsalted popcorn and pretzels. The items listed above may not be a complete list of recommended foods or beverages. Contact your dietitian for more options. WHAT FOODS ARE NOT RECOMMENDED? Grains White bread. White pasta. White rice. Refined cornbread. Bagels and croissants. Crackers that contain trans fat. Vegetables Creamed or fried vegetables. Vegetables in a cheese sauce. Regular canned vegetables. Regular canned tomato sauce and paste. Regular tomato and vegetable juices. Fruits Dried fruits. Canned fruit in light or heavy syrup. Fruit juice. Meat and Other Protein Products Fatty cuts of meat. Ribs, chicken wings, bacon, sausage, bologna, salami, chitterlings, fatback, hot dogs, bratwurst, and packaged luncheon meats. Salted  nuts and seeds. Canned beans with salt. Dairy Whole or 2% milk, cream, half-and-half, and cream cheese. Whole-fat or sweetened yogurt. Full-fat cheeses or  blue cheese. Nondairy creamers and whipped toppings. Processed cheese, cheese spreads, or cheese curds. Condiments Onion and garlic salt, seasoned salt, table salt, and sea salt. Canned and packaged gravies. Worcestershire sauce. Tartar sauce. Barbecue sauce. Teriyaki sauce. Soy sauce, including reduced sodium. Steak sauce. Fish sauce. Oyster sauce. Cocktail sauce. Horseradish. Ketchup and mustard. Meat flavorings and tenderizers. Bouillon cubes. Hot sauce. Tabasco sauce. Marinades. Taco seasonings. Relishes. Fats and Oils Butter, stick margarine, lard, shortening, ghee, and bacon fat. Coconut, palm kernel, or palm oils. Regular salad dressings. Other Pickles and olives. Salted popcorn and pretzels. The items listed above may not be a complete list of foods and beverages to avoid. Contact your dietitian for more information. WHERE CAN I FIND MORE INFORMATION? National Heart, Lung, and Blood Institute: travelstabloid.com   This information is not intended to replace advice given to you by your health care provider. Make sure you discuss any questions you have with your health care provider.   Document Released: 09/05/2011 Document Revised: 10/07/2014 Document Reviewed: 07/21/2013 Elsevier Interactive Patient Education 2016 [employer].    IF you received an x-ray today, you will receive an invoice from [facility]. Please contact [facility] at [phone] with questions or concerns regarding your invoice.   IF you received labwork today, you will receive an invoice from [employer]. Please contact [facility] at [phone] with questions or concerns regarding your invoice.   Our billing staff will not be able to assist you with questions regarding bills from these companies.  You will be contacted with the lab results as soon as they are available. The fastest way to get your results is to activate your My Chart  account. Instructions are located on the last page of this paperwork. If you have not heard from [place] regarding the results in 2 weeks, please contact this office.

## 2015-12-19 NOTE — Progress Notes (Signed)
Subjective:    Patient ID: Dalton Martin, male    DOB: 1946-06-13, 70 y.o.   MRN: [id number]  HPI This is a 70 year old male that presents today with complaints about his pressure x a few months. Pt states that he has to take an extra Linisopril prn when his pressure is high and an extra dose of both if he is feeling really bad. Pt reports that he is taking his medications daily but his pressure continues to increase after activities. Pt reports tingling in his first 3 fingers bilaterally as well. He also worries about clots in bilaterally arms.  Pt reports that he still is exercising. Pt denies swelling, SOB, and chest pain.   Past Medical History  Diagnosis Date  . Hypertension   . Hyperlipidemia   . History of prostate cancer     s/p  radical prostatectomy 2011  . Allergic rhinitis   . Penile lesion   . History of kidney stones   . Wears hearing aid     BILATERAL  . OSA on CPAP     MILD OSA PER STUDY 2013   Family History  Problem Relation Age of Onset  . Liver disease Mother   . Cancer Father    Social History   Social History  . Marital Status: Married    Spouse Name: N/A  . Number of Children: N/A  . Years of Education: N/A   Occupational History  . Not on file.   Social History Main Topics  . Smoking status: Former Smoker -- 1.00 packs/day for 4 years    Types: Cigarettes    Quit date: 05/20/1973  . Smokeless tobacco: Never Used  . Alcohol Use: 2.0 oz/week    4 drink(s) per week     Comment: weekends  . Drug Use: No  . Sexual Activity: Not on file   Other Topics Concern  . Not on file   Social History Narrative    Review of Systems  Constitutional: Negative for fever, activity change and appetite change.  HENT: Negative for congestion, rhinorrhea, sinus pressure and sore throat.   Respiratory: Positive for cough (intermittent- takes mucinex). Negative for chest tightness and shortness of breath.   Cardiovascular: Negative for chest pain and leg  swelling.  Skin:       Pt reports "clots" in bilateral arms along posterior vein.   Neurological: Positive for dizziness (when pressure is high) and headaches (when pressure is high).  Psychiatric/Behavioral: Negative for confusion.       Objective:   Physical Exam  Constitutional: He is oriented to person, place, and time. He appears well-developed and well-nourished.  HENT:  Head: Normocephalic.  Eyes: Conjunctivae are normal.  Neck: Normal range of motion.  Cardiovascular: Normal rate, regular rhythm and normal heart sounds.   Pulmonary/Chest: Effort normal and breath sounds normal. No respiratory distress. He has no wheezes.  Musculoskeletal: Normal range of motion. He exhibits no tenderness.  Neurological: He is alert and oriented to person, place, and time.  Skin: Skin is warm and dry. Rash (along bilateral wrist) noted.  viscosities along bilateral arms, posterior veins  Psychiatric: He has a normal mood and affect. His behavior is normal. Judgment and thought content normal.     BP 128/80 mmHg  Pulse 72  Temp(Src) 98.1 F (36.7 C) (Oral)  Resp 16  Ht 6\' 1"  (1.854 m)  Wt 241 lb (109.317 kg)  BMI 31.80 kg/m2  SpO2 98%  Assessment & Plan:  1. Essential hypertension - CBC - TSH - COMPLETE METABOLIC PANEL WITH GFR - Hemoglobin A1c - Lipid panel - lisinopril (PRINIVIL,ZESTRIL) 40 MG tablet; Take 1 tablet (40 mg total) by mouth every morning.  Dispense: 90 tablet; Refill: 0 - amLODipine (NORVASC) 10 MG tablet; Take 1 tablet (10 mg total) by mouth every evening.  Dispense: 90 tablet; Refill: 0  2. History of prostate cancer - PSA  3. BMI 31.0-31.9,adult - CBC - TSH - COMPLETE METABOLIC PANEL WITH GFR - Hemoglobin A1c - Lipid panel   Steffanie Dunn, FNP-student  [facility] [employer] [facility]  12/19/2015 9:51 AM

## 2015-12-20 LAB — HEMOGLOBIN A1C
HEMOGLOBIN A1C: 6.2 % — AB (ref <5.7)
Mean Plasma Glucose: 131 mg/dL — ABNORMAL HIGH (ref <117)

## 2015-12-20 LAB — PSA: PSA: <0.01 ng/mL (ref <=4.00)

## 2016-01-05 ENCOUNTER — Other Ambulatory Visit: Payer: Self-pay | Admitting: Family Medicine

## 2016-01-05 DIAGNOSIS — I10 Essential (primary) hypertension: Secondary | ICD-10-CM

## 2016-01-08 ENCOUNTER — Other Ambulatory Visit: Payer: Self-pay

## 2016-01-08 DIAGNOSIS — I10 Essential (primary) hypertension: Secondary | ICD-10-CM

## 2016-01-08 MED ORDER — LISINOPRIL 40 MG PO TABS
40.0000 mg | ORAL_TABLET | Freq: Every morning | ORAL | Status: DC
Start: 2016-01-08 — End: 2016-04-16

## 2016-01-08 MED ORDER — LISINOPRIL 40 MG PO TABS: 40.0000 mg | ORAL_TABLET | Freq: Every morning | ORAL | Status: DC

## 2016-01-08 NOTE — Addendum Note (Signed)
Addended by: Jannette Spanner on: 01/08/2016 11:33 AM   Modules accepted: Orders

## 2016-03-13 ENCOUNTER — Other Ambulatory Visit: Payer: Self-pay | Admitting: Family Medicine

## 2016-03-13 ENCOUNTER — Telehealth: Payer: Self-pay

## 2016-03-13 NOTE — Telephone Encounter (Signed)
REFILL amLODipine (NORVASC) 10 MG tablet

## 2016-03-15 NOTE — Telephone Encounter (Signed)
Notified pt RF was sent. 

## 2016-03-29 ENCOUNTER — Other Ambulatory Visit: Payer: Self-pay | Admitting: Family Medicine

## 2016-04-16 ENCOUNTER — Ambulatory Visit (INDEPENDENT_AMBULATORY_CARE_PROVIDER_SITE_OTHER): Payer: Medicare Other | Admitting: Family Medicine

## 2016-04-16 VITALS — BP 134/66 | HR 81 | Temp 98.6°F | Resp 17 | Ht 71.5 in | Wt 234.0 lb

## 2016-04-16 DIAGNOSIS — R7303 Prediabetes: Secondary | ICD-10-CM | POA: Diagnosis not present

## 2016-04-16 DIAGNOSIS — Z23 Encounter for immunization: Secondary | ICD-10-CM | POA: Diagnosis not present

## 2016-04-16 DIAGNOSIS — Z0000 Encounter for general adult medical examination without abnormal findings: Secondary | ICD-10-CM | POA: Diagnosis not present

## 2016-04-16 DIAGNOSIS — E785 Hyperlipidemia, unspecified: Secondary | ICD-10-CM | POA: Diagnosis not present

## 2016-04-16 DIAGNOSIS — G4733 Obstructive sleep apnea (adult) (pediatric): Secondary | ICD-10-CM | POA: Diagnosis not present

## 2016-04-16 DIAGNOSIS — I10 Essential (primary) hypertension: Secondary | ICD-10-CM

## 2016-04-16 DIAGNOSIS — J309 Allergic rhinitis, unspecified: Secondary | ICD-10-CM | POA: Diagnosis not present

## 2016-04-16 LAB — COMPLETE METABOLIC PANEL WITH GFR
ALBUMIN: 4.4 g/dL (ref 3.6–5.1)
ALT: 18 U/L (ref 9–46)
AST: 20 U/L (ref 10–35)
Alkaline Phosphatase: 56 U/L (ref 40–115)
BILIRUBIN TOTAL: 1 mg/dL (ref 0.2–1.2)
BUN: 16 mg/dL (ref 7–25)
CALCIUM: 9.5 mg/dL (ref 8.6–10.3)
CHLORIDE: 103 mmol/L (ref 98–110)
CO2: 26 mmol/L (ref 20–31)
CREATININE: 1.26 mg/dL — AB (ref 0.70–1.25)
GFR, Est African American: 67 mL/min (ref >=60)
GFR, Est Non African American: 58 mL/min — ABNORMAL LOW (ref >=60)
Glucose, Bld: 104 mg/dL — ABNORMAL HIGH (ref 65–99)
Potassium: 4.4 mmol/L (ref 3.5–5.3)
Sodium: 140 mmol/L (ref 135–146)
TOTAL PROTEIN: 7.1 g/dL (ref 6.1–8.1)

## 2016-04-16 LAB — LIPID PANEL
CHOLESTEROL: 189 mg/dL (ref 125–200)
HDL: 41 mg/dL (ref >=40)
LDL CALC: 117 mg/dL (ref <130)
Total CHOL/HDL Ratio: 4.6 Ratio (ref <=5.0)
Triglycerides: 157 mg/dL — ABNORMAL HIGH (ref <150)
VLDL: 31 mg/dL — AB (ref <30)

## 2016-04-16 MED ORDER — AMLODIPINE BESYLATE 10 MG PO TABS: ORAL_TABLET | ORAL | Status: DC

## 2016-04-16 MED ORDER — CETIRIZINE HCL 10 MG PO TABS: 10.0000 mg | ORAL_TABLET | Freq: Every day | ORAL | Status: DC | PRN

## 2016-04-16 MED ORDER — LISINOPRIL 40 MG PO TABS: 40.0000 mg | ORAL_TABLET | Freq: Every morning | ORAL | Status: DC

## 2016-04-16 NOTE — Progress Notes (Addendum)
By signing my name below, I, Dalton Martin, attest that this documentation has been prepared under the direction and in the presence of Dalton Ray, MD.  Electronically Signed: Verlee Martin, Medical Scribe. 04/16/2016. 9:23 AM.  Subjective:    Patient ID: Dalton Martin, male    DOB: [birth year], 70 y.o.   MRN: [id number]  HPI Chief Complaint  Patient presents with  . Annual Exam    HPI Comments: DOY Dalton Martin is a 70 y.o. male with a PMHx of HTN, prostate CA, and allergic rhinitis who presents to the [facility] for his complete physical exam. Pt is fasting for blood work.  Cancer Screening: Colonoscopy: Per previous note, had colonoscopy 3 years ago with repeat planned in 28 years. Pt was told he had nl results. Prostate: Prostatectomy in 2011.  Lab Results  Component Value Date   PSA <0.01 12/19/2015   HTN: Takes Lisinopril and Norvasc. Pt's bp this morning before he came in was 127/72- yesterday was [id number] systolic. Pt mentions he has white coat syndrome. Pt denies experiencing any side affects on his bp medications. Lab Results  Component Value Date   CREATININE 1.06 12/19/2015   Hyperglycemia: Prediabetic. Pt states he's been staying away from sugars. Lab Results  Component Value Date   HGBA1C 6.2* 12/19/2015   Wt Readings from Last 3 Encounters:  04/16/16 234 lb (106.142 kg)  12/19/15 241 lb (109.317 kg)  05/27/14 247 lb (112.038 kg)  Body mass index is 32.19 kg/(m^2).  HLD:  Lab Results  Component Value Date   CHOL 215* 12/19/2015   HDL 45 12/19/2015   LDLCALC 137* 12/19/2015   TRIG 167* 12/19/2015   CHOLHDL 4.8 12/19/2015   Lab Results  Component Value Date   ALT 15 12/19/2015   AST 15 12/19/2015   ALKPHOS 68 12/19/2015   BILITOT 0.6 12/19/2015   Allergic Rhinitis: Takes Zyrtec. Pt would like to get a refill on Zyrtec. Pt denies experiencing fatigue while on Zyrtec. Pt denies experiencing sleep apnea. Pt stopped using his CPAP  last December- it broke. Pt got his CPAP through the [employer].  Immunizations: Pt has had the shingles vaccine 3 years ago- pt had shingles and was treated 10 days for it. Pt isn't sure if he had his PNA, or tetanus vaccine- pt is willing to get the PNA vaccine today. Pt would like to wait on the the tetanus shot.    There is no immunization history on file for this patient.  Exercise: Pt exercises at least 3 days a week for about an hour each time. Pt denies chest pain, chest tightness, and trouble breathing while exercising.  Dentist: Pt has all his natural teeth. Pt hasn't seen a dentist.  Vision: Pt plans to see his ophthalmologist either today or tomorrow. Pt does wear glasses.  Visual Acuity Screening   Right eye Left eye Both eyes  Without correction:     With correction: 20/30 20/25 20/25    Advance Directives: Paper work was given to pt about advance directives.  Depression: Depression screen [facility] 2/9 04/16/2016 12/19/2015  Decreased Interest 0 0  Down, Depressed, Hopeless 0 0  PHQ - 2 Score 0 0   Fall Screening: No falls in the past year.  Functional Status Survey: Pt states he still gets around fine and his hearing is fine.  Patient Active Problem List   Diagnosis Date Noted  . Substernal chest pain 07/01/2012  . Lightheadedness 07/01/2012  . Hypertension, uncontrolled 07/01/2012  Past Medical History  Diagnosis Date  . Hypertension   . Hyperlipidemia   . History of prostate cancer     s/p  radical prostatectomy 2011  . Allergic rhinitis   . Penile lesion   . History of kidney stones   . Wears hearing aid     BILATERAL  . OSA on CPAP     MILD OSA PER STUDY 2013  . Cancer [facility])    Past Surgical History  Procedure Laterality Date  . Robot assisted laparoscopic radical prostatectomy  11-15-2009    right nerve spare/  bilateral pelvic lymphadenectomy  . Cardiac catheterization  07-01-2012  dr Angelena Form    normal coronary arteries/   ef  60-65%  . Inguinal  hernia repair Bilateral as teen  . Lesion destruction N/A 05/27/2014    Procedure: EXCISION OF PENIS LESIONS;  Surgeon: Claybon Jabs, MD;  Location: [facility];  Service: Urology;  Laterality: N/A;  . Left heart catheterization with coronary angiogram N/A 07/01/2012    Procedure: LEFT HEART CATHETERIZATION WITH CORONARY ANGIOGRAM;  Surgeon: Wellington Hampshire, MD;  Location: [place] CATH [facility];  Service: Cardiovascular;  Laterality: N/A;  . Prostate surgery     No Known Allergies Prior to Admission medications   Medication Sig Start Date End Date Taking? Authorizing Provider  amLODipine (NORVASC) 10 MG tablet TAKE 1 TABLET (10 MG TOTAL) BY MOUTH EVERY EVENING. 03/14/16  Yes Chelle Jeffery, PA-C  aspirin EC 81 MG tablet Take 81 mg by mouth daily.   Yes Historical Provider, MD  cetirizine (ZYRTEC) 10 MG tablet Take 10 mg by mouth daily as needed for allergies.   Yes Historical Provider, MD  cholecalciferol (VITAMIN D) 1000 UNITS tablet Take 1,000 Units by mouth daily. Reported on 12/19/2015   Yes Historical Provider, MD  ibuprofen (ADVIL,MOTRIN) 800 MG tablet Take 800 mg by mouth every 8 (eight) hours as needed for mild pain. Reported on 12/19/2015   Yes Historical Provider, MD  lisinopril (PRINIVIL,ZESTRIL) 40 MG tablet Take 1 tablet (40 mg total) by mouth every morning. 01/08/16  Yes Elby Beck, FNP  Magnesium 500 MG CAPS Take 1 capsule by mouth daily.   Yes Historical Provider, MD  Multiple Vitamins-Minerals (MENS MULTIVITAMIN PLUS PO) Take 1 tablet by mouth daily.   Yes Historical Provider, MD  Potassium 95 MG TABS Take 1 tablet by mouth daily. Reported on 12/19/2015   Yes Historical Provider, MD   Social History   Social History  . Marital Status: Married    Spouse Name: N/A  . Number of Children: N/A  . Years of Education: N/A   Occupational History  . Not on file.   Social History Main Topics  . Smoking status: Former Smoker -- 1.00 packs/day for 4 years    Types:  Cigarettes    Quit date: 05/20/1973  . Smokeless tobacco: Never Used  . Alcohol Use: 2.0 oz/week    4 Standard drinks or equivalent per week     Comment: weekends  . Drug Use: No  . Sexual Activity: Not on file   Other Topics Concern  . Not on file   Social History Narrative   Review of Systems  Respiratory: Negative for chest tightness and shortness of breath.   Cardiovascular: Negative for chest pain.  13 point ROS was negative Objective:  BP 154/84 mmHg  Pulse 81  Temp(Src) 98.6 F (37 C) (Oral)  Resp 17  Ht 5' 11.5" (1.816 m)  Wt 234 lb (  106.142 kg)  BMI 32.19 kg/m2  SpO2 98%  Physical Exam  Constitutional: He is oriented to person, place, and time. He appears well-developed and well-nourished.  HENT:  Head: Normocephalic and atraumatic.  Right Ear: External ear normal.  Left Ear: External ear normal.  Mouth/Throat: Oropharynx is clear and moist.  Eyes: Conjunctivae and EOM are normal. Pupils are equal, round, and reactive to light.  Neck: Normal range of motion. Neck supple. No thyromegaly present.  Cardiovascular: Normal rate, regular rhythm, normal heart sounds and intact distal pulses.   Pulmonary/Chest: Effort normal and breath sounds normal. No respiratory distress. He has no wheezes.  Abdominal: Soft. He exhibits no distension. There is no tenderness.  Musculoskeletal: Normal range of motion. He exhibits no edema or tenderness.  Lymphadenopathy:    He has no cervical adenopathy.  Neurological: He is alert and oriented to person, place, and time. He has normal reflexes.  Skin: Skin is warm and dry.  Psychiatric: He has a normal mood and affect. His behavior is normal.  Vitals reviewed.    Assessment & Plan:   ROCKWELL ZELDIN is a 71 y.o. male Medicare annual wellness visit, subsequent  - -anticipatory guidance as below in AVS, screening labs above. Health maintenance items as above in HPI discussed/recommended as applicable.   Essential hypertension -  Plan: amLODipine (NORVASC) 10 MG tablet, lisinopril (PRINIVIL,ZESTRIL) 40 MG tablet  -Or alignment office, suspect whitecoat component. Home readings okay. No change in meds for now.   Hyperlipidemia - Plan: COMPLETE METABOLIC PANEL WITH GFR, Lipid panel  -Check lipids, cmp  Prediabetes - Plan: Hemoglobin A1C  Continue to work on weight and diet. Handout given on diet.  Allergic rhinitis, unspecified allergic rhinitis type - Plan: cetirizine (ZYRTEC) 10 MG tablet  -Stable. Continue Zyrtec.   OSA (obstructive sleep apnea)  -Currently not using CPAP. Advised to call his previous supplier to determine if replacement equipment can be obtained. Let me know if I can help in this process.  Need for prophylactic vaccination against Streptococcus pneumoniae (pneumococcus) - Plan: Pneumococcal conjugate vaccine 13-valent IM -Prevnar given.   Meds ordered this encounter  Medications  . amLODipine (NORVASC) 10 MG tablet    Sig: TAKE 1 TABLET (10 MG TOTAL) BY MOUTH EVERY EVENING.    Dispense:  90 tablet    Refill:  1  . cetirizine (ZYRTEC) 10 MG tablet    Sig: Take 1 tablet (10 mg total) by mouth daily as needed for allergies.    Dispense:  90 tablet    Refill:  3  . lisinopril (PRINIVIL,ZESTRIL) 40 MG tablet    Sig: Take 1 tablet (40 mg total) by mouth every morning.    Dispense:  90 tablet    Refill:  1   Patient Instructions       IF you received an x-Martin today, you will receive an invoice from [facility]. Please contact [facility] at [phone] with questions or concerns regarding your invoice.   IF you received labwork today, you will receive an invoice from [employer]. Please contact [facility] at [phone] with questions or concerns regarding your invoice.   Our billing staff will not be able to assist you with questions regarding bills from these companies.  You will be contacted with the lab results as soon as they are  available. The fastest way to get your results is to activate your My Chart account. Instructions are located on the last page of this paperwork. If you  have not heard from [place] regarding the results in 2 weeks, please contact this office.    Restart CPAP, and call your provider to obtain new machine if needed.  No change in meds for now.   Keeping you healthy  Get these tests  Blood pressure- Have your blood pressure checked once a year by your healthcare provider.  Normal blood pressure is 120/80  Weight- Have your body mass index (BMI) calculated to screen for obesity.  BMI is a measure of body fat based on height and weight. You can also calculate your own BMI at [email or web address].  Cholesterol- Have your cholesterol checked every year.  Diabetes- Have your blood sugar checked regularly if you have high blood pressure, high cholesterol, have a family history of diabetes or if you are overweight.  Screening for Colon Cancer- Colonoscopy starting at age 37.  Screening may begin sooner depending on your family history and other health conditions. Follow up colonoscopy as directed by your Gastroenterologist.  Screening for Prostate Cancer- Both blood work (PSA) and a rectal exam help screen for Prostate Cancer.  Screening begins at age 4 with African-American men and at age 21 with Caucasian men.  Screening may begin sooner depending on your family history.  Take these medicines  Aspirin- One aspirin daily can help prevent Heart disease and Stroke.  Flu shot- Every fall.  Tetanus- Every 10 years.  Zostavax- Once after the age of 88 to prevent Shingles.  Pneumonia shot- Once after the age of 78; if you are younger than 17, ask your healthcare provider if you need a Pneumonia shot.  Take these steps  Don't smoke- If you do smoke, talk to your doctor about quitting.  For tips on how to quit, go to www.smokefree.gov or call 1-800-QUIT-NOW.  Be physically active- Exercise 5  days a week for at least 30 minutes.  If you are not already physically active start slow and gradually work up to 30 minutes of moderate physical activity.  Examples of moderate activity include walking briskly, mowing the yard, dancing, swimming, bicycling, etc.  Eat a healthy diet- Eat a variety of healthy food such as fruits, vegetables, low fat milk, low fat cheese, yogurt, lean meant, poultry, fish, beans, tofu, etc. For more information go to www.thenutritionsource.org  Drink alcohol in moderation- Limit alcohol intake to less than two drinks a day. Never drink and drive.  Dentist- Brush and floss twice daily; visit your dentist twice a year.  Depression- Your emotional health is as important as your physical health. If you're feeling down, or losing interest in things you would normally enjoy please talk to your healthcare provider.  Eye exam- Visit your eye doctor every year.  Safe sex- If you may be exposed to a sexually transmitted infection, use a condom.  Seat belts- Seat belts can save your life; always wear one.  Smoke/Carbon Monoxide detectors- These detectors need to be installed on the appropriate level of your home.  Replace batteries at least once a year.  Skin cancer- When out in the sun, cover up and use sunscreen 15 SPF or higher.  Violence- If anyone is threatening you, please tell your healthcare provider.  Living Will/ Health care power of attorney- Speak with your healthcare provider and family.    Prediabetes Eating Plan Prediabetes--also called impaired glucose tolerance or impaired fasting glucose--is a condition that causes blood sugar (blood glucose) levels to be higher than normal. Following a healthy diet can help to keep prediabetes under control.  It can also help to lower the risk of type 2 diabetes and heart disease, which are increased in people who have prediabetes. Along with regular exercise, a healthy diet:  Promotes weight loss.  Helps to  control blood sugar levels.  Helps to improve the way that the body uses insulin. WHAT DO I NEED TO KNOW ABOUT THIS EATING PLAN?  Use the glycemic index (GI) to plan your meals. The index tells you how quickly a food will raise your blood sugar. Choose low-GI foods. These foods take a longer time to raise blood sugar.  Pay close attention to the amount of carbohydrates in the food that you eat. Carbohydrates increase blood sugar levels.  Keep track of how many calories you take in. Eating the right amount of calories will help you to achieve a healthy weight. Losing about 7 percent of your starting weight can help to prevent type 2 diabetes.  You may want to follow a Mediterranean diet. This diet includes a lot of vegetables, lean meats or fish, whole grains, fruits, and healthy oils and fats. WHAT FOODS CAN I EAT? Grains Whole grains, such as whole-wheat or whole-grain breads, crackers, cereals, and pasta. Unsweetened oatmeal. Bulgur. Barley. Quinoa. Brown rice. Corn or whole-wheat flour tortillas or taco shells. Vegetables Lettuce. Spinach. Peas. Beets. Cauliflower. Cabbage. Broccoli. Carrots. Tomatoes. Squash. Eggplant. Herbs. Peppers. Onions. Cucumbers. Brussels sprouts. Fruits Berries. Bananas. Apples. Oranges. Grapes. Papaya. Mango. Pomegranate. Kiwi. Grapefruit. Cherries. Meats and Other Protein Sources Seafood. Lean meats, such as chicken and [place] or lean cuts of pork and beef. Tofu. Eggs. Nuts. Beans. Dairy Low-fat or fat-free dairy products, such as yogurt, cottage cheese, and cheese. Beverages Water. Tea. Coffee. Sugar-free or diet soda. Seltzer water. Milk. Milk alternatives, such as soy or almond milk. Condiments Mustard. Relish. Low-fat, low-sugar ketchup. Low-fat, low-sugar barbecue sauce. Low-fat or fat-free mayonnaise. Sweets and Desserts Sugar-free or low-fat pudding. Sugar-free or low-fat ice cream and other frozen treats. Fats and Oils Avocado. Walnuts. Olive  oil. The items listed above may not be a complete list of recommended foods or beverages. Contact your dietitian for more options.  WHAT FOODS ARE NOT RECOMMENDED? Grains Refined white flour and flour products, such as bread, pasta, snack foods, and cereals. Beverages Sweetened drinks, such as sweet iced tea and soda. Sweets and Desserts Baked goods, such as cake, cupcakes, pastries, cookies, and cheesecake. The items listed above may not be a complete list of foods and beverages to avoid. Contact your dietitian for more information.   This information is not intended to replace advice given to you by your health care provider. Make sure you discuss any questions you have with your health care provider.   Document Released: 01/31/2015 Document Reviewed: 01/31/2015 Elsevier Interactive Patient Education [employer].       I personally performed the services described in this documentation, which was scribed in my presence. The recorded information has been reviewed and considered, and addended by me as needed.   Signed,   Dalton Ray, MD [facility].  04/16/2016 12:17 PM

## 2016-04-16 NOTE — Patient Instructions (Addendum)
IF you received an x-ray today, you will receive an invoice from [facility]. Please contact [facility] at [phone] with questions or concerns regarding your invoice.   IF you received labwork today, you will receive an invoice from [employer]. Please contact [facility] at [phone] with questions or concerns regarding your invoice.   Our billing staff will not be able to assist you with questions regarding bills from these companies.  You will be contacted with the lab results as soon as they are available. The fastest way to get your results is to activate your My Chart account. Instructions are located on the last page of this paperwork. If you have not heard from [place] regarding the results in 2 weeks, please contact this office.    Restart CPAP, and call your provider to obtain new machine if needed.  No change in meds for now.   Keeping you healthy  Get these tests  Blood pressure- Have your blood pressure checked once a year by your healthcare provider.  Normal blood pressure is 120/80  Weight- Have your body mass index (BMI) calculated to screen for obesity.  BMI is a measure of body fat based on height and weight. You can also calculate your own BMI at [email or web address].  Cholesterol- Have your cholesterol checked every year.  Diabetes- Have your blood sugar checked regularly if you have high blood pressure, high cholesterol, have a family history of diabetes or if you are overweight.  Screening for Colon Cancer- Colonoscopy starting at age 30.  Screening may begin sooner depending on your family history and other health conditions. Follow up colonoscopy as directed by your Gastroenterologist.  Screening for Prostate Cancer- Both blood work (PSA) and a rectal exam help screen for Prostate Cancer.  Screening begins at age 59 with African-American men and at age 50 with Caucasian men.  Screening may begin sooner  depending on your family history.  Take these medicines  Aspirin- One aspirin daily can help prevent Heart disease and Stroke.  Flu shot- Every fall.  Tetanus- Every 10 years.  Zostavax- Once after the age of 14 to prevent Shingles.  Pneumonia shot- Once after the age of 47; if you are younger than 50, ask your healthcare provider if you need a Pneumonia shot.  Take these steps  Don't smoke- If you do smoke, talk to your doctor about quitting.  For tips on how to quit, go to www.smokefree.gov or call 1-800-QUIT-NOW.  Be physically active- Exercise 5 days a week for at least 30 minutes.  If you are not already physically active start slow and gradually work up to 30 minutes of moderate physical activity.  Examples of moderate activity include walking briskly, mowing the yard, dancing, swimming, bicycling, etc.  Eat a healthy diet- Eat a variety of healthy food such as fruits, vegetables, low fat milk, low fat cheese, yogurt, lean meant, poultry, fish, beans, tofu, etc. For more information go to www.thenutritionsource.org  Drink alcohol in moderation- Limit alcohol intake to less than two drinks a day. Never drink and drive.  Dentist- Brush and floss twice daily; visit your dentist twice a year.  Depression- Your emotional health is as important as your physical health. If you're feeling down, or losing interest in things you would normally enjoy please talk to your healthcare provider.  Eye exam- Visit your eye doctor every year.  Safe sex- If you may be exposed to a sexually transmitted infection, use a condom.  Seat  belts- Seat belts can save your life; always wear one.  Smoke/Carbon Monoxide detectors- These detectors need to be installed on the appropriate level of your home.  Replace batteries at least once a year.  Skin cancer- When out in the sun, cover up and use sunscreen 15 SPF or higher.  Violence- If anyone is threatening you, please tell your healthcare  provider.  Living Will/ Health care power of attorney- Speak with your healthcare provider and family.    Prediabetes Eating Plan Prediabetes--also called impaired glucose tolerance or impaired fasting glucose--is a condition that causes blood sugar (blood glucose) levels to be higher than normal. Following a healthy diet can help to keep prediabetes under control. It can also help to lower the risk of type 2 diabetes and heart disease, which are increased in people who have prediabetes. Along with regular exercise, a healthy diet:  Promotes weight loss.  Helps to control blood sugar levels.  Helps to improve the way that the body uses insulin. WHAT DO I NEED TO KNOW ABOUT THIS EATING PLAN?  Use the glycemic index (GI) to plan your meals. The index tells you how quickly a food will raise your blood sugar. Choose low-GI foods. These foods take a longer time to raise blood sugar.  Pay close attention to the amount of carbohydrates in the food that you eat. Carbohydrates increase blood sugar levels.  Keep track of how many calories you take in. Eating the right amount of calories will help you to achieve a healthy weight. Losing about 7 percent of your starting weight can help to prevent type 2 diabetes.  You may want to follow a Mediterranean diet. This diet includes a lot of vegetables, lean meats or fish, whole grains, fruits, and healthy oils and fats. WHAT FOODS CAN I EAT? Grains Whole grains, such as whole-wheat or whole-grain breads, crackers, cereals, and pasta. Unsweetened oatmeal. Bulgur. Barley. Quinoa. Brown rice. Corn or whole-wheat flour tortillas or taco shells. Vegetables Lettuce. Spinach. Peas. Beets. Cauliflower. Cabbage. Broccoli. Carrots. Tomatoes. Squash. Eggplant. Herbs. Peppers. Onions. Cucumbers. Brussels sprouts. Fruits Berries. Bananas. Apples. Oranges. Grapes. Papaya. Mango. Pomegranate. Kiwi. Grapefruit. Cherries. Meats and Other Protein Sources Seafood. Lean  meats, such as chicken and [place] or lean cuts of pork and beef. Tofu. Eggs. Nuts. Beans. Dairy Low-fat or fat-free dairy products, such as yogurt, cottage cheese, and cheese. Beverages Water. Tea. Coffee. Sugar-free or diet soda. Seltzer water. Milk. Milk alternatives, such as soy or almond milk. Condiments Mustard. Relish. Low-fat, low-sugar ketchup. Low-fat, low-sugar barbecue sauce. Low-fat or fat-free mayonnaise. Sweets and Desserts Sugar-free or low-fat pudding. Sugar-free or low-fat ice cream and other frozen treats. Fats and Oils Avocado. Walnuts. Olive oil. The items listed above may not be a complete list of recommended foods or beverages. Contact your dietitian for more options.  WHAT FOODS ARE NOT RECOMMENDED? Grains Refined white flour and flour products, such as bread, pasta, snack foods, and cereals. Beverages Sweetened drinks, such as sweet iced tea and soda. Sweets and Desserts Baked goods, such as cake, cupcakes, pastries, cookies, and cheesecake. The items listed above may not be a complete list of foods and beverages to avoid. Contact your dietitian for more information.   This information is not intended to replace advice given to you by your health care provider. Make sure you discuss any questions you have with your health care provider.   Document Released: 01/31/2015 Document Reviewed: 01/31/2015 Elsevier Interactive Patient Education [employer].

## 2016-04-17 LAB — HEMOGLOBIN A1C
Hgb A1c MFr Bld: 6.1 % — ABNORMAL HIGH (ref <5.7)
MEAN PLASMA GLUCOSE: 128 mg/dL

## 2016-04-19 ENCOUNTER — Ambulatory Visit (INDEPENDENT_AMBULATORY_CARE_PROVIDER_SITE_OTHER): Payer: Medicare Other | Admitting: Physician Assistant

## 2016-04-19 VITALS — BP 134/78 | HR 86 | Temp 98.3°F | Resp 16 | Ht 73.0 in | Wt 232.0 lb

## 2016-04-19 DIAGNOSIS — I10 Essential (primary) hypertension: Secondary | ICD-10-CM | POA: Diagnosis not present

## 2016-04-19 DIAGNOSIS — J309 Allergic rhinitis, unspecified: Secondary | ICD-10-CM | POA: Diagnosis not present

## 2016-04-19 MED ORDER — LISINOPRIL 40 MG PO TABS: 40.0000 mg | ORAL_TABLET | Freq: Every morning | ORAL | Status: DC

## 2016-04-19 MED ORDER — CETIRIZINE HCL 10 MG PO TABS: 10.0000 mg | ORAL_TABLET | Freq: Every day | ORAL | Status: DC | PRN

## 2016-04-19 MED ORDER — AMLODIPINE BESYLATE 10 MG PO TABS: ORAL_TABLET | ORAL | Status: DC

## 2016-04-19 NOTE — Progress Notes (Signed)
[facility] [address] [place] [address] [phone]  Date:  04/19/2016   Name:  Dalton Martin   DOB:  May 31, 1946   MRN:  [id number]  PCP:  Default, Provider, MD    History of Present Illness:  Chief Complaint  Patient presents with  . Medication Refill    Zyrtec  . Hypertension    Blood Pressure Check, Pt has his personal machine with him. May need calibrating. 168/60 Manual, 157/73 Automatic Pts machine    Dalton ELIZARDO is a 70 y.o. male patient who presents to [facility] for cc of HTN, and allergies. --no missed doses. --No chest pains, palpitations, sob, leg swelling. No vision changes. -- watching sodium intake --He does silver sneakers daily. --no side effects with medication --like refill of zyrtec.  Takes symptomatically.        Patient Active Problem List   Diagnosis Date Noted  . Substernal chest pain 07/01/2012  . Lightheadedness 07/01/2012  . Hypertension, uncontrolled 07/01/2012    Past Medical History  Diagnosis Date  . Hypertension   . Hyperlipidemia   . History of prostate cancer     s/p  radical prostatectomy 2011  . Allergic rhinitis   . Penile lesion   . History of kidney stones   . Wears hearing aid     BILATERAL  . OSA on CPAP     MILD OSA PER STUDY 2013  . Cancer [facility])     Past Surgical History  Procedure Laterality Date  . Robot assisted laparoscopic radical prostatectomy  11-15-2009    right nerve spare/  bilateral pelvic lymphadenectomy  . Cardiac catheterization  07-01-2012  dr Angelena Form    normal coronary arteries/   ef  60-65%  . Inguinal hernia repair Bilateral as teen  . Lesion destruction N/A 05/27/2014    Procedure: EXCISION OF PENIS LESIONS;  Surgeon: Claybon Jabs, MD;  Location: [facility];  Service: Urology;  Laterality: N/A;  . Left heart catheterization with coronary angiogram N/A 07/01/2012    Procedure: LEFT HEART CATHETERIZATION WITH CORONARY ANGIOGRAM;  Surgeon: Wellington Hampshire, MD;  Location: [facility] CATH [facility];  Service: Cardiovascular;  Laterality: N/A;  . Prostate surgery      Social History  Substance Use Topics  . Smoking status: Former Smoker -- 1.00 packs/day for 4 years    Types: Cigarettes    Quit date: 05/20/1973  . Smokeless tobacco: Never Used  . Alcohol Use: 2.0 oz/week    4 Standard drinks or equivalent per week     Comment: weekends    Family History  Problem Relation Age of Onset  . Liver disease Mother   . Cancer Father     No Known Allergies  Medication list has been reviewed and updated.  Current Outpatient Prescriptions on File Prior to Visit  Medication Sig Dispense Refill  . amLODipine (NORVASC) 10 MG tablet TAKE 1 TABLET (10 MG TOTAL) BY MOUTH EVERY EVENING. 90 tablet 1  . aspirin EC 81 MG tablet Take 81 mg by mouth daily.    . cetirizine (ZYRTEC) 10 MG tablet Take 1 tablet (10 mg total) by mouth daily as needed for allergies. 90 tablet 3  . cholecalciferol (VITAMIN D) 1000 UNITS tablet Take 1,000 Units by mouth daily. Reported on 12/19/2015    . ibuprofen (ADVIL,MOTRIN) 800 MG tablet Take 800 mg by mouth every 8 (eight) hours as needed for mild pain. Reported on 12/19/2015    .  lisinopril (PRINIVIL,ZESTRIL) 40 MG tablet Take 1 tablet (40 mg total) by mouth every morning. 90 tablet 1  . Magnesium 500 MG CAPS Take 1 capsule by mouth daily.    . Multiple Vitamins-Minerals (MENS MULTIVITAMIN PLUS PO) Take 1 tablet by mouth daily.    . Potassium 95 MG TABS Take 1 tablet by mouth daily. Reported on 12/19/2015     No current facility-administered medications on file prior to visit.    ROS ROS otherwise unremarkable unless listed above.   Physical Examination: BP 168/60 mmHg  Pulse 86  Temp(Src) 98.3 F (36.8 C) (Oral)  Resp 16  Ht 6\' 1"  (1.854 m)  Wt 232 lb (105.235 kg)  BMI 30.62 kg/m2  SpO2 99% Ideal Body Weight: Weight in (lb) to have BMI = 25: 189.1  Physical Exam  Constitutional: He is oriented to person, place, and  time. He appears well-developed and well-nourished. No distress.  HENT:  Head: Normocephalic and atraumatic.  Right Ear: Tympanic membrane, external ear and ear canal normal.  Left Ear: Tympanic membrane, external ear and ear canal normal.  Nose: No mucosal edema or rhinorrhea. Right sinus exhibits no maxillary sinus tenderness and no frontal sinus tenderness. Left sinus exhibits no maxillary sinus tenderness and no frontal sinus tenderness.  Mouth/Throat: No uvula swelling. No oropharyngeal exudate, posterior oropharyngeal edema or posterior oropharyngeal erythema.  Eyes: Conjunctivae, EOM and lids are normal. Pupils are equal, round, and reactive to light. Right eye exhibits normal extraocular motion. Left eye exhibits normal extraocular motion.  Neck: Trachea normal and full passive range of motion without pain. No edema and no erythema present.  Cardiovascular: Normal rate, regular rhythm, normal heart sounds and intact distal pulses.  Exam reveals no gallop and no friction rub.   No murmur heard. Pulses:      Carotid pulses are 2+ on the right side, and 2+ on the left side.      Radial pulses are 2+ on the right side, and 2+ on the left side.       Dorsalis pedis pulses are 2+ on the right side, and 2+ on the left side.  Pulmonary/Chest: Effort normal and breath sounds normal. No apnea. No respiratory distress. He has no decreased breath sounds. He has no wheezes. He has no rhonchi.  Neurological: He is alert and oriented to person, place, and time.  Skin: Skin is warm and dry. He is not diaphoretic.  Psychiatric: He has a normal mood and affect. His behavior is normal.     Assessment and Plan: Dalton Martin is a 70 y.o. male who is here today for HTN, and allergic rhinitis.  --refilling blood pressure medicines.  Zyrtec also refilled.  --we discussed kidney and liver function. --also discussed a1c borderline, however stable.  Encouraged his exercise.  Allergic rhinitis,  unspecified allergic rhinitis type - Plan: cetirizine (ZYRTEC) 10 MG tablet  Essential hypertension - Plan: amLODipine (NORVASC) 10 MG tablet, lisinopril (PRINIVIL,ZESTRIL) 40 MG tablet  Ivar Drape, PA-C [facility] 04/19/2016 11:43 AM

## 2016-04-19 NOTE — Patient Instructions (Addendum)
     IF you received an x-ray today, you will receive an invoice from [facility]. Please contact [facility] at [phone] with questions or concerns regarding your invoice.   IF you received labwork today, you will receive an invoice from [employer]. Please contact [facility] at [phone] with questions or concerns regarding your invoice.   Our billing staff will not be able to assist you with questions regarding bills from these companies.  You will be contacted with the lab results as soon as they are available. The fastest way to get your results is to activate your My Chart account. Instructions are located on the last page of this paperwork. If you have not heard from [place] regarding the results in 2 weeks, please contact this office.    Please return at 6 months.

## 2016-09-03 ENCOUNTER — Telehealth: Payer: Self-pay

## 2016-09-03 NOTE — Telephone Encounter (Signed)
Last seen 03/2016 last ov.  NP from [facility] called and did a wellness visit on pt. 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

## 2016-09-03 NOTE — Telephone Encounter (Signed)
Noted.  recommend a visit to discuss this further, but specifically what test was done? ABI's?  Could also arrange for him to meet with vascular specialist if symptomatic.

## 2016-09-05 NOTE — Telephone Encounter (Signed)
Lm for patient to call for appt

## 2016-09-07 ENCOUNTER — Ambulatory Visit: Payer: Medicare Other

## 2016-09-14 ENCOUNTER — Ambulatory Visit (INDEPENDENT_AMBULATORY_CARE_PROVIDER_SITE_OTHER): Payer: Medicare Other

## 2016-09-14 ENCOUNTER — Ambulatory Visit (INDEPENDENT_AMBULATORY_CARE_PROVIDER_SITE_OTHER): Payer: Medicare Other | Admitting: Physician Assistant

## 2016-09-14 VITALS — BP 122/70 | HR 71 | Temp 98.2°F | Resp 18 | Ht 73.0 in | Wt 227.0 lb

## 2016-09-14 DIAGNOSIS — G8929 Other chronic pain: Secondary | ICD-10-CM

## 2016-09-14 DIAGNOSIS — M5441 Lumbago with sciatica, right side: Secondary | ICD-10-CM | POA: Diagnosis not present

## 2016-09-14 DIAGNOSIS — Z23 Encounter for immunization: Secondary | ICD-10-CM

## 2016-09-14 DIAGNOSIS — Z09 Encounter for follow-up examination after completed treatment for conditions other than malignant neoplasm: Secondary | ICD-10-CM

## 2016-09-14 MED ORDER — IBUPROFEN 800 MG PO TABS
ORAL_TABLET | ORAL | 1 refills | Status: DC
Start: 2016-09-14 — End: 2016-10-19

## 2016-09-14 MED ORDER — FLUTICASONE PROPIONATE 0.05 % EX CREA: TOPICAL_CREAM | Freq: Two times a day (BID) | CUTANEOUS | 3 refills | Status: DC

## 2016-09-14 NOTE — Progress Notes (Signed)
09/14/2016 10:06 AM   DOB: 03/25/1946 / MRN: [id number]  SUBJECTIVE:  Dalton Martin is a 70 y.o. male presenting for complaint of "a blood flow prolem."  Patient says a nurse told him he had poor blood flow in his right foot per a pulse oximeter. However on the wellness sheet she wrote "repeat stress test, carotid ultrasound, has first degree relative who had MI." This visit occurred on the 3rd of December. She also recommended he have aTDAP.  Reports that he can walk miles without any pain in his legs.  Also complains of back pain in the right side with radiation down the right leg.   He does not take cholesterol medication (lipid panel below), he takes ASA 81 daily for years, he quit smokking 43 years ago and has a 4 pack year history. He does have a history of well controlled HTN. He does like to exercise on the TM via walkkng 20 minutes about 4 days a week and likes to lift weights as well. His brother had an MI at age 31 and he thinks his brother took fare care of himself and was a non smoker and not obese.   He complains of back pain that will often radiate down the back of his right leg when bending over. Says this goes away with rest.  Has tried Ibuprofen 800 mg PRN and this relieves the pain.  Denies loss of bowel or bladder control and any persistent paresthesia. Previous MRI does show some facet disease at L5-S1.    Depression screen PHQ 2/9 09/14/2016  Decreased Interest 0  Down, Depressed, Hopeless 0  PHQ - 2 Score 0   Immunization History  Administered Date(s) Administered  . Influenza-Unspecified 07/31/2016  . Pneumococcal Conjugate-13 04/16/2016  . Tdap 09/14/2016     He has No Known Allergies.   He  has a past medical history of Allergic rhinitis; Cancer ([place]); History of kidney stones; History of prostate cancer; Hyperlipidemia; Hypertension; OSA on CPAP; Penile lesion; and Wears hearing aid.    He  reports that he quit smoking about 43 years ago. His smoking use  included Cigarettes. He has a 4.00 pack-year smoking history. He has never used smokeless tobacco. He reports that he drinks about 2.0 oz of alcohol per week . He reports that he does not use drugs. He  has no sexual activity history on file. The patient  has a past surgical history that includes Robot assisted laparoscopic radical prostatectomy (11-15-2009); Cardiac catheterization (07-01-2012  dr Angelena Form); Inguinal hernia repair (Bilateral, as teen); Lesion destruction (N/A, 05/27/2014); left heart catheterization with coronary angiogram (N/A, 07/01/2012); and Prostate surgery.  His family history includes Cancer in his father; Liver disease in his mother.  Review of Systems  Constitutional: Negative for chills and fever.  Cardiovascular: Negative for chest pain and leg swelling.  Gastrointestinal: Negative for nausea.  Skin: Negative for itching and rash.  Neurological: Negative for dizziness.    The problem list and medications were reviewed and updated by myself where necessary and exist elsewhere in the encounter.   OBJECTIVE:  BP 122/70 (BP Location: Right Arm, Patient Position: Sitting, Cuff Size: Small)   Pulse 71   Temp 98.2 F (36.8 C) (Oral)   Resp 18   Ht 6\' 1"  (1.854 m)   Wt 227 lb (103 kg)   SpO2 99%   BMI 29.95 kg/m   Lab Results  Component Value Date   CHOL 189 04/16/2016   HDL 41 04/16/2016  LDLCALC 117 04/16/2016   TRIG 157 (H) 04/16/2016   CHOLHDL 4.6 04/16/2016   Lab Results  Component Value Date   HGBA1C 6.1 (H) 04/16/2016      Physical Exam  Constitutional: He is oriented to person, place, and time. He appears well-developed. He does not appear ill.  Eyes: Conjunctivae and EOM are normal. Pupils are equal, round, and reactive to light.  Cardiovascular: Normal rate and normal heart sounds.   Pulses:      Carotid pulses are 2+ on the right side, and 2+ on the left side.      Radial pulses are 2+ on the right side, and 2+ on the left side.        Dorsalis pedis pulses are 2+ on the right side, and 2+ on the left side.       Posterior tibial pulses are 2+ on the right side, and 2+ on the left side.  Pulmonary/Chest: Effort normal.  Abdominal: He exhibits no distension.  Musculoskeletal: Normal range of motion.  Neurological: He is alert and oriented to person, place, and time. No cranial nerve deficit. Coordination normal.  Skin: Skin is warm and dry. He is not diaphoretic.  Psychiatric: He has a normal mood and affect.  Nursing note and vitals reviewed.   .Patient:  Dalton, Martin MR #:    [id number] Study Date: 04/18/2012 Gender:   M Age:    70 Height:   185.4cm Weight:   113.4kg BSA:    2.94m^2 Pt. Status: Room:    A05C  PERFORMING  Shvc ATTENDING  Teressa Lower Deberah Pelton SONOGRAPHER Mauricio Po, RDCS, CCT cc:  ------------------------------------------------------------ LV EF: 60% -  65%  ------------------------------------------------------------ Indications:   TIA 435.9.  ------------------------------------------------------------ Study Conclusions  - Left ventricle: The cavity size was normal. There was mild concentric hypertrophy. Systolic function was normal. The estimated ejection fraction was in the range of 60% to 65%. Wall motion was normal; there were no regional wall motion abnormalities. Left ventricular diastolic function parameters were normal. - Left atrium: The atrium was mildly dilated. - Atrial septum: No defect or patent foramen ovale was identified. Transthoracic echocardiography. M-mode, complete 2D, spectral Doppler, and color Doppler. Height: Height: 185.4cm. Height: 73in. Weight: Weight: 113.4kg. Weight: 249.5lb. Body mass index: BMI: 33kg/m^2. Body surface area:  BSA: 2.32m^2. Patient status: Inpatient. Location: Echo  laboratory.  ------------------------------------------------------------  ------------------------------------------------------------ Left ventricle: The cavity size was normal. There was mild concentric hypertrophy. Systolic function was normal. The estimated ejection fraction was in the range of 60% to 65%. Wall motion was normal; there were no regional wall motion abnormalities. The transmitral flow pattern was normal. The deceleration time of the early transmitral flow velocity was normal. The pulmonary vein flow pattern was normal. The tissue Doppler parameters were normal. Left ventricular diastolic function parameters were normal.  ------------------------------------------------------------ Aortic valve:  Trileaflet; normal thickness leaflets. Mobility was not restricted. Doppler: Transvalvular velocity was within the normal range. There was no stenosis. No regurgitation.  ------------------------------------------------------------ Aorta: Aortic root: The aortic root was normal in size.  ------------------------------------------------------------ Mitral valve:  Structurally normal valve.  Mobility was not restricted. Doppler: Transvalvular velocity was within the normal range. There was no evidence for stenosis. No regurgitation.  ------------------------------------------------------------ Left atrium: The atrium was mildly dilated.  ------------------------------------------------------------ Atrial septum: No defect or patent foramen ovale was identified.  ------------------------------------------------------------ Right ventricle: The cavity size was normal. Wall thickness was normal. Systolic function was normal.  ------------------------------------------------------------ Pulmonic valve:  Poorly  visualized. The valve appears to be grossly normal.  Doppler: Transvalvular velocity was within the normal range. There was no evidence for  stenosis.  ------------------------------------------------------------ Tricuspid valve:  Structurally normal valve.  Doppler: Transvalvular velocity was within the normal range. No regurgitation.  ------------------------------------------------------------ Pulmonary artery:  The main pulmonary artery was normal-sized. Systolic pressure could not be accurately estimated.  ------------------------------------------------------------ Right atrium: The atrium was normal in size.  ------------------------------------------------------------ Pericardium: There was no pericardial effusion.  ------------------------------------------------------------ Systemic veins: Inferior vena cava: The vessel was normal in size; the respirophasic diameter changes were in the normal range (= 50%); findings are consistent with normal central venous pressure.  ------------------------------------------------------------    Hemodynamic Findings: Central aortic pressure: 116/65 Left ventricular pressure: 126/6/11  Angiographic Findings:  Left main: No obstructive disease.   Left Anterior Descending Artery: Large caliber vessel that courses to the apex. Moderate sized diagonal branch with no disease.   Circumflex Artery: Moderate sized vessel with no disease noted.   Right Coronary Artery: Large caliber, dominant vessel with no disease.   Left Ventricular Angiogram: LVEF 60-65%.   Impression: 1. No angiographic evidence of CAD 2. Normal LV systolic function 3. Non-cardiac chest pain.   Recommendations: No further cardiac workup. Discharge home after bedrest. He can follow up with his primary care physician.        Complications:  None. The patient tolerated the procedure well.                    Electronically signed by Burnell Blanks, MD at 07/01/2012 3:19 PM      No results found for this or any previous visit (from the past 72 hour(s)).  Dg Lumbar  Spine Complete  Result Date: 09/14/2016 CLINICAL DATA:  Lifting injury at work in 2013. Right low back pain. EXAM: LUMBAR SPINE - COMPLETE 4+ VIEW COMPARISON:  MRI 03/27/2006. FINDINGS: Degenerative disc disease changes at L3-4 and L4-5 with disc space narrowing, spurring, and vacuum disc. Degenerative facet disease in the lower lumbar spine. Normal alignment. No fracture. SI joints are symmetric and unremarkable. IMPRESSION: Degenerative disc disease.  No acute bony abnormality. Electronically Signed   By: Rolm Baptise M.D.   On: 09/14/2016 09:48    ASSESSMENT AND PLAN  Judy was seen today for follow-up and medication refill.  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  Tylenol 1 gram and if pain uncontrolled use ibuprfen sparingly.  -     DG Lumbar Spine Complete; Future  Follow up: Cath and cardiac echo are in the report. Given the assessment performed by his wellness nurse he has 2+ DP and PT pulses and has no symptoms of PAD.  No cartid bruits.  Heart cath in 2013 showed no coronary disease.  His lipid are unremarkable, he does not smoke, is not diabeticand has controlled HTN. I don't see the benefit in sending him to vascular and having him repeat cardiac testing after such a good report in 2013.  I will administer TDAP.     The patient is advised to call or return to clinic if he does not see an improvement in symptoms, or to seek the care of the closest emergency department if he worsens with the above plan.   Philis Fendt, MHS, PA-C [facility] 09/14/2016 10:06 AM

## 2016-09-14 NOTE — Patient Instructions (Signed)
     IF you received an x-ray today, you will receive an invoice from [facility]. Please contact [facility] at [phone] with questions or concerns regarding your invoice.   IF you received labwork today, you will receive an invoice from [facility]. Please contact [facility] at [phone] with questions or concerns regarding your invoice.   Our billing staff will not be able to assist you with questions regarding bills from these companies.  You will be contacted with the lab results as soon as they are available. The fastest way to get your results is to activate your My Chart account. Instructions are located on the last page of this paperwork. If you have not heard from us regarding the results in 2 weeks, please contact this office.     

## 2016-10-19 ENCOUNTER — Other Ambulatory Visit: Payer: Self-pay

## 2016-10-19 MED ORDER — IBUPROFEN 800 MG PO TABS: ORAL_TABLET | ORAL | 0 refills | Status: DC

## 2016-10-19 MED ORDER — FLUTICASONE PROPIONATE 50 MCG/ACT NA SUSP: 1.0000 | Freq: Every day | NASAL | 3 refills | Status: DC

## 2016-10-19 NOTE — Telephone Encounter (Signed)
[facility] req 90 day supply Fluticasone and Ibuprofen sent

## 2016-11-11 ENCOUNTER — Other Ambulatory Visit: Payer: Self-pay | Admitting: Family Medicine

## 2016-11-11 DIAGNOSIS — I10 Essential (primary) hypertension: Secondary | ICD-10-CM

## 2016-12-18 ENCOUNTER — Other Ambulatory Visit: Payer: Self-pay | Admitting: Family Medicine

## 2016-12-18 DIAGNOSIS — I10 Essential (primary) hypertension: Secondary | ICD-10-CM

## 2017-09-06 ENCOUNTER — Inpatient Hospital Stay (HOSPITAL_COMMUNITY)
Admission: EM | Admit: 2017-09-06 | Discharge: 2017-09-08 | DRG: 310 | Disposition: A | Discharge status: Home Health | Payer: Medicare Other | Attending: Internal Medicine | Admitting: Internal Medicine

## 2017-09-06 ENCOUNTER — Encounter (HOSPITAL_COMMUNITY): Payer: Self-pay

## 2017-09-06 ENCOUNTER — Other Ambulatory Visit: Payer: Self-pay

## 2017-09-06 ENCOUNTER — Emergency Department (HOSPITAL_COMMUNITY): Payer: Medicare Other

## 2017-09-06 DIAGNOSIS — Z7951 Long term (current) use of inhaled steroids: Secondary | ICD-10-CM

## 2017-09-06 DIAGNOSIS — R001 Bradycardia, unspecified: Secondary | ICD-10-CM | POA: Diagnosis present

## 2017-09-06 DIAGNOSIS — R9431 Abnormal electrocardiogram [ECG] [EKG]: Secondary | ICD-10-CM | POA: Diagnosis not present

## 2017-09-06 DIAGNOSIS — G4733 Obstructive sleep apnea (adult) (pediatric): Secondary | ICD-10-CM | POA: Diagnosis not present

## 2017-09-06 DIAGNOSIS — S82832A Other fracture of upper and lower end of left fibula, initial encounter for closed fracture: Secondary | ICD-10-CM

## 2017-09-06 DIAGNOSIS — R42 Dizziness and giddiness: Secondary | ICD-10-CM

## 2017-09-06 DIAGNOSIS — M25562 Pain in left knee: Secondary | ICD-10-CM | POA: Diagnosis present

## 2017-09-06 DIAGNOSIS — Y990 Civilian activity done for income or pay: Secondary | ICD-10-CM

## 2017-09-06 DIAGNOSIS — I10 Essential (primary) hypertension: Secondary | ICD-10-CM | POA: Diagnosis present

## 2017-09-06 DIAGNOSIS — E669 Obesity, unspecified: Secondary | ICD-10-CM | POA: Diagnosis not present

## 2017-09-06 DIAGNOSIS — Z87891 Personal history of nicotine dependence: Secondary | ICD-10-CM

## 2017-09-06 DIAGNOSIS — E785 Hyperlipidemia, unspecified: Secondary | ICD-10-CM | POA: Diagnosis present

## 2017-09-06 DIAGNOSIS — I959 Hypotension, unspecified: Secondary | ICD-10-CM | POA: Diagnosis present

## 2017-09-06 DIAGNOSIS — S82409A Unspecified fracture of shaft of unspecified fibula, initial encounter for closed fracture: Secondary | ICD-10-CM | POA: Diagnosis present

## 2017-09-06 DIAGNOSIS — Z0389 Encounter for observation for other suspected diseases and conditions ruled out: Secondary | ICD-10-CM

## 2017-09-06 DIAGNOSIS — Z6828 Body mass index (BMI) 28.0-28.9, adult: Secondary | ICD-10-CM | POA: Diagnosis not present

## 2017-09-06 DIAGNOSIS — Z8546 Personal history of malignant neoplasm of prostate: Secondary | ICD-10-CM

## 2017-09-06 DIAGNOSIS — W010XXA Fall on same level from slipping, tripping and stumbling without subsequent striking against object, initial encounter: Secondary | ICD-10-CM | POA: Diagnosis not present

## 2017-09-06 DIAGNOSIS — IMO0001 Reserved for inherently not codable concepts without codable children: Secondary | ICD-10-CM

## 2017-09-06 DIAGNOSIS — S82435A Nondisplaced oblique fracture of shaft of left fibula, initial encounter for closed fracture: Secondary | ICD-10-CM | POA: Diagnosis present

## 2017-09-06 DIAGNOSIS — Z7982 Long term (current) use of aspirin: Secondary | ICD-10-CM | POA: Diagnosis not present

## 2017-09-06 LAB — CBC WITH DIFFERENTIAL/PLATELET
Basophils Absolute: 0 10*3/uL (ref 0.0–0.1)
Basophils Relative: 0 %
Eosinophils Absolute: 0.1 10*3/uL (ref 0.0–0.7)
Eosinophils Relative: 1 %
HEMATOCRIT: 38.8 % — AB (ref 39.0–52.0)
HEMOGLOBIN: 13 g/dL (ref 13.0–17.0)
LYMPHS ABS: 1.5 10*3/uL (ref 0.7–4.0)
LYMPHS PCT: 18 %
MCH: 29.7 pg (ref 26.0–34.0)
MCHC: 33.5 g/dL (ref 30.0–36.0)
MCV: 88.6 fL (ref 78.0–100.0)
MONOS PCT: 8 %
Monocytes Absolute: 0.7 10*3/uL (ref 0.1–1.0)
NEUTROS ABS: 6.2 10*3/uL (ref 1.7–7.7)
NEUTROS PCT: 73 %
Platelets: 176 10*3/uL (ref 150–400)
RBC: 4.38 MIL/uL (ref 4.22–5.81)
RDW: 13.9 % (ref 11.5–15.5)
WBC: 8.4 10*3/uL (ref 4.0–10.5)

## 2017-09-06 LAB — I-STAT TROPONIN, ED: Troponin i, poc: 0 ng/mL (ref 0.00–0.08)

## 2017-09-06 LAB — TROPONIN I: Troponin I: <0.03 ng/mL (ref <0.03)

## 2017-09-06 LAB — BASIC METABOLIC PANEL
Anion gap: 9 (ref 5–15)
BUN: 9 mg/dL (ref 6–20)
CHLORIDE: 104 mmol/L (ref 101–111)
CO2: 24 mmol/L (ref 22–32)
CREATININE: 1.16 mg/dL (ref 0.61–1.24)
Calcium: 8.8 mg/dL — ABNORMAL LOW (ref 8.9–10.3)
GFR calc Af Amer: >60 mL/min (ref >60)
GFR calc non Af Amer: >60 mL/min (ref >60)
Glucose, Bld: 132 mg/dL — ABNORMAL HIGH (ref 65–99)
POTASSIUM: 3.7 mmol/L (ref 3.5–5.1)
Sodium: 137 mmol/L (ref 135–145)

## 2017-09-06 LAB — URINALYSIS, ROUTINE W REFLEX MICROSCOPIC
BILIRUBIN URINE: NEGATIVE
Glucose, UA: NEGATIVE mg/dL
Hgb urine dipstick: NEGATIVE
KETONES UR: NEGATIVE mg/dL
LEUKOCYTES UA: NEGATIVE
NITRITE: NEGATIVE
PH: 6 (ref 5.0–8.0)
Protein, ur: NEGATIVE mg/dL
SPECIFIC GRAVITY, URINE: 1.013 (ref 1.005–1.030)

## 2017-09-06 LAB — MAGNESIUM: Magnesium: 2 mg/dL (ref 1.7–2.4)

## 2017-09-06 LAB — TSH: TSH: 0.378 u[IU]/mL (ref 0.350–4.500)

## 2017-09-06 MED ORDER — VITAMIN B-12 1000 MCG PO TABS
1000.0000 ug | ORAL_TABLET | Freq: Every day | ORAL | Status: DC
  Administered 2017-09-06 – 2017-09-08 (×3): 1000 ug via ORAL
  Filled 2017-09-06 (×3): qty 1

## 2017-09-06 MED ORDER — SODIUM CHLORIDE 0.9 % IV SOLN
INTRAVENOUS | Status: AC
  Administered 2017-09-06 – 2017-09-07 (×2): via INTRAVENOUS

## 2017-09-06 MED ORDER — FLUTICASONE PROPIONATE 50 MCG/ACT NA SUSP
1.0000 | Freq: Every day | NASAL | Status: DC | PRN
  Administered 2017-09-08: 1 via NASAL
  Filled 2017-09-06: qty 16

## 2017-09-06 MED ORDER — LORATADINE 10 MG PO TABS
10.0000 mg | ORAL_TABLET | Freq: Every day | ORAL | Status: DC
  Administered 2017-09-06 – 2017-09-08 (×3): 10 mg via ORAL
  Filled 2017-09-06 (×3): qty 1

## 2017-09-06 MED ORDER — ONDANSETRON HCL 4 MG/2ML IJ SOLN
4.0000 mg | Freq: Once | INTRAMUSCULAR | Status: AC
  Administered 2017-09-06: 4 mg via INTRAVENOUS
  Filled 2017-09-06: qty 2

## 2017-09-06 MED ORDER — ENOXAPARIN SODIUM 40 MG/0.4ML ~~LOC~~ SOLN
40.0000 mg | SUBCUTANEOUS | Status: DC
  Administered 2017-09-06 – 2017-09-08 (×3): 40 mg via SUBCUTANEOUS
  Filled 2017-09-06 (×3): qty 0.4

## 2017-09-06 MED ORDER — VITAMIN D 1000 UNITS PO TABS
1000.0000 [IU] | ORAL_TABLET | Freq: Every day | ORAL | Status: DC
  Administered 2017-09-06 – 2017-09-08 (×3): 1000 [IU] via ORAL
  Filled 2017-09-06 (×3): qty 1

## 2017-09-06 MED ORDER — SENNOSIDES-DOCUSATE SODIUM 8.6-50 MG PO TABS: 1.0000 | ORAL_TABLET | Freq: Every evening | ORAL | Status: DC | PRN

## 2017-09-06 MED ORDER — SODIUM CHLORIDE 0.9 % IV BOLUS (SEPSIS)
1000.0000 mL | Freq: Once | INTRAVENOUS | Status: AC
  Administered 2017-09-06: 1000 mL via INTRAVENOUS

## 2017-09-06 MED ORDER — ACETAMINOPHEN 650 MG RE SUPP: 650.0000 mg | Freq: Four times a day (QID) | RECTAL | Status: DC | PRN

## 2017-09-06 MED ORDER — HYDROCODONE-ACETAMINOPHEN 5-325 MG PO TABS
1.0000 | ORAL_TABLET | ORAL | Status: DC | PRN
  Administered 2017-09-06: 1 via ORAL
  Administered 2017-09-07: 2 via ORAL
  Administered 2017-09-07: 1 via ORAL
  Administered 2017-09-08 (×2): 2 via ORAL
  Filled 2017-09-06 (×3): qty 2
  Filled 2017-09-06 (×2): qty 1

## 2017-09-06 MED ORDER — ONDANSETRON HCL 4 MG/2ML IJ SOLN: 4.0000 mg | Freq: Four times a day (QID) | INTRAMUSCULAR | Status: DC | PRN

## 2017-09-06 MED ORDER — ONDANSETRON HCL 4 MG PO TABS: 4.0000 mg | ORAL_TABLET | Freq: Four times a day (QID) | ORAL | Status: DC | PRN

## 2017-09-06 MED ORDER — FENTANYL CITRATE (PF) 100 MCG/2ML IJ SOLN
50.0000 ug | Freq: Once | INTRAMUSCULAR | Status: AC
  Administered 2017-09-06: 50 ug via INTRAVENOUS
  Filled 2017-09-06: qty 2

## 2017-09-06 MED ORDER — ACETAMINOPHEN 325 MG PO TABS
650.0000 mg | ORAL_TABLET | Freq: Four times a day (QID) | ORAL | Status: DC | PRN
  Administered 2017-09-07: 650 mg via ORAL
  Filled 2017-09-06: qty 2

## 2017-09-06 NOTE — ED Triage Notes (Signed)
Pt coming from home. Pt sustained a fall while walking and talking on the phone. Pt landed on the left leg and heard a pop in the back of the knee and some swelling noted to the right ankle. Pt states knee can be moved straight but severe pain when bending knee. Pt is axox4 and has full range of motion in left ankle but limited movement in knee.

## 2017-09-06 NOTE — Plan of Care (Signed)
Patient stable since admission to [facility], heart rate will go as low as 40's but rebounds to 50's to 70's.  Patient asymptomatic.  Left leg pain only with movement.  Medicated for pain x one after getting up to [facility] with improvement.  Wife at bedside.

## 2017-09-06 NOTE — ED Notes (Addendum)
Wife to nurse first stating that pt is not responding to her.  Pt sitting in wheelchair with eyes closed.  Opened eyes to voice and answers questions.  States he feels like he is going to pass out.  HR 36-40. Pt diaphoretic.  Pt to treatment room.

## 2017-09-06 NOTE — ED Provider Notes (Addendum)
TIME SEEN: 4:15 AM  CHIEF COMPLAINT: Left leg pain, bradycardia  HPI: Patient is a 71 year old male with history of hyperlipidemia, prostate cancer status post radical prostatectomy in 2011 who presents to the emergency department with left leg pain.  He states at 1:30 PM yesterday afternoon he tripped over something on the floor when talking on the phone and states he landed with his left leg bent up underneath him.  Felt a pop in the leg.  Unable to ambulate afterwards because of pain and swelling.  No head injury.  No neck or back pain.  While in the waiting room patient had an episode where he felt like he was going to pass out.  He was sitting down.  He denies any significant pain in his leg at that time.  He states that he had no chest pain or shortness of breath.  The staff noted that his heart rate was in the 01B and his systolic blood pressure was in the 60s.  Brought immediately back to a room.  Heart rate is in the 40s intermittently.  He is not sure if his heart rate normally.  He is not on beta-blockers.  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  He does not currently have a primary care doctor.  ROS: See HPI Constitutional: no fever  Eyes: no drainage  ENT: no runny nose   Cardiovascular:  no chest pain  Resp: no SOB  GI: no vomiting GU: no dysuria Integumentary: no rash  Allergy: no hives  Musculoskeletal: no leg swelling  Neurological: no slurred speech ROS otherwise negative  PAST MEDICAL HISTORY/PAST SURGICAL HISTORY:  Past Medical History:  Diagnosis Date  . Allergic rhinitis   . Cancer ([place])   . History of kidney stones   . History of prostate cancer    s/p  radical prostatectomy 2011  . Hyperlipidemia   . Hypertension   . OSA on CPAP    MILD OSA PER STUDY 2013  . Penile lesion   . Wears hearing aid    BILATERAL    MEDICATIONS:  Prior to Admission medications   Medication Sig Start Date End Date Taking? Authorizing Provider  amLODipine (NORVASC) 10 MG tablet TAKE 1  TABLET (10 MG TOTAL) BY MOUTH EVERY EVENING. 04/19/16   Ivar Drape D, PA  amLODipine (NORVASC) 10 MG tablet TAKE 1 TABLET BY MOUTH EVERY EVENING 11/12/16   Ivar Drape D, PA  aspirin EC 81 MG tablet Take 81 mg by mouth daily.    [provider]  cetirizine (ZYRTEC) 10 MG tablet Take 1 tablet (10 mg total) by mouth daily as needed for allergies. Patient not taking: Reported on 09/14/2016 04/19/16   Ivar Drape D, PA  cholecalciferol (VITAMIN D) 1000 UNITS tablet Take 1,000 Units by mouth daily. Reported on 12/19/2015    [provider]  fluticasone (CUTIVATE) 0.05 % cream Apply topically 2 (two) times daily. 09/14/16   Tereasa Coop, PA-C  fluticasone (FLONASE) 50 MCG/ACT nasal spray Place 1 spray into both nostrils daily. 10/19/16   Tereasa Coop, PA-C  ibuprofen (ADVIL,MOTRIN) 800 MG tablet Take up to one daily and sparingly. 10/19/16   Tereasa Coop, PA-C  lisinopril (PRINIVIL,ZESTRIL) 40 MG tablet TAKE 1 TABLET BY MOUTH EVERY MORNING 12/18/16   Wendie Agreste, MD  Magnesium 500 MG CAPS Take 1 capsule by mouth daily.    [provider]  Multiple Vitamins-Minerals (MENS MULTIVITAMIN PLUS PO) Take 1 tablet by mouth daily.  [provider]  Potassium 95 MG TABS Take 1 tablet by mouth daily. Reported on 12/19/2015    [provider]  triamcinolone cream (KENALOG) 0.1 % Apply 1 application topically 2 (two) times daily.    [provider]    ALLERGIES:  No Known Allergies  SOCIAL HISTORY:  Social History   Tobacco Use  . Smoking status: Former Smoker    Packs/day: 1.00    Years: 4.00    Pack years: 4.00    Types: Cigarettes    Last attempt to quit: 05/20/1973    Years since quitting: 44.3  . Smokeless tobacco: Never Used  Substance Use Topics  . Alcohol use: Yes    Alcohol/week: 2.0 oz    Types: 4 Standard drinks or equivalent per week    Comment: weekends    FAMILY HISTORY: Family History  Problem  Relation Age of Onset  . Liver disease Mother   . Cancer Father     EXAM: BP 116/61 (BP Location: Left Arm)   Pulse (!) 45   Temp 98.2 F (36.8 C) (Oral)   Resp 19   Ht 6\' 1"  (1.854 m)   SpO2 94%   BMI 29.95 kg/m  CONSTITUTIONAL: Alert and oriented and responds appropriately to questions. Well-appearing; well-nourished HEAD: Normocephalic, atraumatic EYES: Conjunctivae clear, pupils appear equal, EOMI ENT: normal nose; moist mucous membranes NECK: Supple, no meningismus, no nuchal rigidity, no LAD, no midline spinal tenderness or step-off or deformity CARD: Regular and bradycardic; S1 and S2 appreciated; no murmurs, no clicks, no rubs, no gallops RESP: Normal chest excursion without splinting or tachypnea; breath sounds clear and equal bilaterally; no wheezes, no rhonchi, no rales, no hypoxia or respiratory distress, speaking full sentences ABD/GI: Normal bowel sounds; non-distended; soft, non-tender, no rebound, no guarding, no peritoneal signs, no hepatosplenomegaly BACK:  The back appears normal and is non-tender to palpation, there is no CVA tenderness, no midline spinal tenderness or step-off or deformity EXT: Patient has significant tenderness over the anterior medial aspect of the left knee with a moderate joint effusion without erythema.  I am unable to test ligamentous laxity of this joint given he has significant pain and is unable to tolerate flexing his knee.  He is able to flex it to approximately 30 degrees.  He has full extension.  Patient also has pain and swelling noted to the left medial and lateral ankle.  There is a small amount of ecchymosis.  2+ DP pulses bilaterally.  No tenderness over the proximal fibula.  No tenderness over the foot.  No tenderness over the hips.  Otherwise he has normal ROM in all joints; otherwise his extremities are non-tender to palpation; no edema; normal capillary refill; no cyanosis, no calf tenderness or swelling    SKIN: Normal color for  age and race; warm; no rash NEURO: Moves all extremities equally, reports normal sensation diffusely, cranial nerves II through XII intact, normal speech PSYCH: The patient's mood and manner are appropriate. Grooming and personal hygiene are appropriate.  MEDICAL DECISION MAKING: Patient here with what sounds like a mechanical fall.  He has a left distal fibula fracture that is non-displaced.  He is neurovascular intact distally.  He is a significant amount of knee swelling but I am unable to test for ligamentous laxity.  He could have an ACL tear.  He has no fracture on x-ray.  We will place him in a short leg splint.  Will give pain medication.  While in the waiting  room patient had an episode where his heart rate dropped into the 30s and he had a systolic blood pressure in the 60s.  Currently his heart rate is in the 48N and his systolic blood pressures in the 90s.  He does not describe this as a vagal event.  I have recommended that we check labs including cardiac labs, electrolytes, chest x-ray and give IV fluids.  We will place the pacer pads on patient and I feel he will need to be admitted to the hospital.  Patient and wife are in agreement.  EKG shows bradycardia without interval abnormality or arrhythmia.  No ischemic abnormality.  ED PROGRESS: Heart rate has improved into the 50s.  His labs are unremarkable.  Normal electrolytes.  Negative troponin.  Clear chest x-ray.  Will discuss with medicine for admission.  Currently he does not have a PCP.  6:13 AM Discussed patient's case with hospitalist, Dr. Myna Hidalgo.  I have recommended admission and patient (and family if present) agree with this plan. Admitting physician will place admission orders.    Patient's heart rate and blood pressure have improved slowly.  I have not had to give him any medication for his bradycardia.  I feel he should be observed given this prolonged episode of bradycardia and hypotension.  I do not feel that I can just chalk  this up to a vasovagal episode.    I reviewed all nursing notes, vitals, pertinent previous records, EKGs, lab and urine results, imaging (as available).    SPLINT APPLICATION Date/Time: 4:62 AM Authorized by: Cyril Mourning Ward Consent: Verbal consent obtained. Risks and benefits: risks, benefits and alternatives were discussed Consent given by: patient Splint applied by: orthopedic technician Location details: left leg  Splint type: Short leg splint with stirrups Supplies used: fiberglass Post-procedure: The splinted body part was neurovascularly unchanged following the procedure. Patient tolerance: Patient tolerated the procedure well with no immediate complications.    CRITICAL CARE Performed by: Pryor Curia   Total critical care time: 40 minutes  Critical care time was exclusive of separately billable procedures and treating other patients.  Critical care was necessary to treat or prevent imminent or life-threatening deterioration.  Critical care was time spent personally by me on the following activities: development of treatment plan with patient and/or surrogate as well as nursing, discussions with consultants, evaluation of patient's response to treatment, examination of patient, obtaining history from patient or surrogate, ordering and performing treatments and interventions, ordering and review of laboratory studies, ordering and review of radiographic studies, pulse oximetry and re-evaluation of patient's condition.      EKG Interpretation  Date/Time:  Saturday September 06 2017 04:04:50 EST Ventricular Rate:  43 PR Interval:    QRS Duration: 87 QT Interval:  432 QTC Calculation: 366 R Axis:   59 Text Interpretation:  Sinus bradycardia Baseline wander in lead(s) V3 Confirmed by Ward, Cyril Mourning [phone]) on 09/06/2017 4:14:55 AM         Ward, Delice Bison, DO 09/06/17 [place], Delice Bison, DO 09/06/17 0938

## 2017-09-06 NOTE — ED Notes (Signed)
The pt had a fall and injured his rt ankle  He has periods of bradycardia ocassionally at home  Tonight his heart rate is in the high fifties to low 40s

## 2017-09-06 NOTE — H&P (Signed)
History and Physical    SABASTIEN TYLER HKV:[id number] DOB: [birth year] DOA: 09/06/2017  PCP: Default, Provider, MD Patient coming from: home  Chief Complaint: fall/ankle pain  HPI: Dalton Martin is a very pleasant 71 y.o. male with medical history significant for attention, hyperlipidemia and obesity presents to the emergency Department chief complaint pain in his left ankle after a mechanical fall. Initial evaluation reveals hypotension, bradycardia and fibula fracture. [facility] are asked to admit  Information is obtained from the patient and his wife who is at the bedside and also a retired Marine scientist. He states he was in his usual state of health he was at work he "slipped on a plastic cord" that was on the floor as he was turning around and his leg "went underneath me". He also reports that he felt a "pop" in his leg at the time. He was unable to ambulate afterwards due to pain and swelling. He denies any head injury or loss of consciousness. He reports that while he was in the waiting room of the emergency department he became lightheaded and felt like he was going to "pass out". Staff noted at that time had a heart rate in the upper 56E and his systolic blood pressure was in the mid 60s. To a room immediately. At that time his heart rate was in the 40s intermittently. He denies headache dizziness chest pain palpitation shortness of breath. He denies any extreme pain in his leg. He does take lisinopril 40 mg. The wife reports the last year his antihypertensive medications have been adjusted as he has lost 95 pounds over the last 4 years is required less antihypertensive medications. 8 months ago amlodipine was discontinued and the only antihypertensive med he is currently on his 40 mg of lisinopril. He should've wife concur that typically he is symptomatic when his blood pressure is elevated. Of late he denies any nausea vomiting diarrhea. He states he's been eating and drinking his normal  amount. He denies dysuria hematuria frequency or urgency.   ED Course: Emergency department he is afebrile heart rate range is 33-29, systolic blood pressure range 68-134. Is provided with pain medication IV fluids and a splint is placed on his left leg  Review of Systems: As per HPI otherwise all other systems reviewed and are negative.   Ambulatory Status: Ambulates independently is independent with ADLs  Past Medical History:  Diagnosis Date  . Allergic rhinitis   . Cancer ([place])   . History of kidney stones   . History of prostate cancer    s/p  radical prostatectomy 2011  . Hyperlipidemia   . Hypertension   . OSA on CPAP    MILD OSA PER STUDY 2013  . Penile lesion   . Wears hearing aid    BILATERAL    Past Surgical History:  Procedure Laterality Date  . CARDIAC CATHETERIZATION  07-01-2012  dr Angelena Form   normal coronary arteries/   ef  60-65%  . INGUINAL HERNIA REPAIR Bilateral as teen  . LEFT HEART CATHETERIZATION WITH CORONARY ANGIOGRAM N/A 07/01/2012   Procedure: LEFT HEART CATHETERIZATION WITH CORONARY ANGIOGRAM;  Surgeon: Wellington Hampshire, MD;  Location: [facility];  Service: Cardiovascular;  Laterality: N/A;  . LESION DESTRUCTION N/A 05/27/2014   Procedure: EXCISION OF PENIS LESIONS;  Surgeon: Claybon Jabs, MD;  Location: [facility];  Service: Urology;  Laterality: N/A;  . PROSTATE SURGERY    . ROBOT ASSISTED LAPAROSCOPIC RADICAL PROSTATECTOMY  11-15-2009  right nerve spare/  bilateral pelvic lymphadenectomy    Social History   Socioeconomic History  . Marital status: Married    Spouse name: Not on file  . Number of children: Not on file  . Years of education: Not on file  . Highest education level: Not on file  Social Needs  . Financial resource strain: Not on file  . Food insecurity - worry: Not on file  . Food insecurity - inability: Not on file  . Transportation needs - medical: Not on file  . Transportation needs - non-medical: Not  on file  Occupational History  . Not on file  Tobacco Use  . Smoking status: Former Smoker    Packs/day: 1.00    Years: 4.00    Pack years: 4.00    Types: Cigarettes    Last attempt to quit: 05/20/1973    Years since quitting: 44.3  . Smokeless tobacco: Never Used  Substance and Sexual Activity  . Alcohol use: Yes    Alcohol/week: 2.0 oz    Types: 4 Standard drinks or equivalent per week    Comment: weekends  . Drug use: No  . Sexual activity: Not on file  Other Topics Concern  . Not on file  Social History Narrative  . Not on file    No Known Allergies  Family History  Problem Relation Age of Onset  . Liver disease Mother   . Cancer Father     Prior to Admission medications   Medication Sig Start Date End Date Taking? Authorizing Provider  cetirizine (ZYRTEC) 10 MG tablet Take 1 tablet (10 mg total) by mouth daily as needed for allergies. 04/19/16  Yes Ivar Drape D, PA  cholecalciferol (VITAMIN D) 1000 UNITS tablet Take 1,000 Units by mouth daily. Reported on 12/19/2015   Yes [provider]  Cyanocobalamin (VITAMIN B-12 PO) Take 1 tablet by mouth daily.   Yes [provider]  fluticasone (CUTIVATE) 0.05 % cream Apply topically 2 (two) times daily. Patient taking differently: Apply 1 application topically 2 (two) times daily as needed (rash).  09/14/16  Yes Tereasa Coop, PA-C  fluticasone (FLONASE) 50 MCG/ACT nasal spray Place 1 spray into both nostrils daily. Patient taking differently: Place 1 spray into both nostrils daily as needed for allergies.  10/19/16  Yes Tereasa Coop, PA-C  ibuprofen (ADVIL,MOTRIN) 800 MG tablet Take up to one daily and sparingly. Patient taking differently: Take 800 mg by mouth every 8 (eight) hours as needed for moderate pain.  10/19/16  Yes Tereasa Coop, PA-C  lisinopril (PRINIVIL,ZESTRIL) 40 MG tablet TAKE 1 TABLET BY MOUTH EVERY MORNING 12/18/16  Yes Wendie Agreste, MD  Menthol, Topical Analgesic,  (BIOFREEZE EX) Apply 1 application topically daily as needed (pain).   Yes [provider]  Multiple Vitamins-Minerals (MENS MULTIVITAMIN PLUS PO) Take 1 tablet by mouth daily.   Yes [provider]  triamcinolone cream (KENALOG) 0.1 % Apply 1 application topically 2 (two) times daily as needed (rash).    Yes [provider]    Physical Exam: Vitals:   09/06/17 0404 09/06/17 0515 09/06/17 0530 09/06/17 0645  BP: 116/61 106/67 114/60 96/64  Pulse: (!) 45 (!) 47 (!) 59 (!) 44  Resp: 19 14 (!) 23 16  Temp: 98.2 F (36.8 C)     TempSrc: Oral     SpO2: 94% 98% 98% 98%  Height:         General:  Appears calm and comfortable in  no acute distress Eyes:  PERRL, EOMI, normal lids, iris ENT:  grossly normal hearing, lips & tongue, weakness membranes of his mouth are pink slightly dry Neck:  no LAD, masses or thyromegaly Cardiovascular:  Sounds are distant but regular no murmur gallop or rub no lower extremity edema Respiratory:  CTA bilaterally, no w/r/r. Normal respiratory effort. Abdomen:  soft, ntnd, is a bowel sounds throughout no guarding or rebounding Skin:  no rash or induration seen on limited exam Musculoskeletal:  Splint to left lower extremity intact toes warm otherwise joints without swelling/erythema Psychiatric:  grossly normal mood and affect, speech fluent and appropriate, AOx3 Neurologic:  CN 2-12 grossly intact, moves all extremities in coordinated fashion, sensation intact. Speech clear facial symmetry  Labs on Admission: I have personally reviewed following labs and imaging studies  CBC: Recent Labs  Lab 09/06/17 0507  WBC 8.4  NEUTROABS 6.2  HGB 13.0  HCT 38.8*  MCV 88.6  PLT 354   Basic Metabolic Panel: Recent Labs  Lab 09/06/17 0507  NA 137  K 3.7  CL 104  CO2 24  GLUCOSE 132*  BUN 9  CREATININE 1.16  CALCIUM 8.8*  MG 2.0   GFR: CrCl cannot be calculated (Unknown ideal weight.). Liver Function Tests: No results for  input(s): AST, ALT, ALKPHOS, BILITOT, PROT, ALBUMIN in the last 168 hours. No results for input(s): LIPASE, AMYLASE in the last 168 hours. No results for input(s): AMMONIA in the last 168 hours. Coagulation Profile: No results for input(s): INR, PROTIME in the last 168 hours. Cardiac Enzymes: No results for input(s): CKTOTAL, CKMB, CKMBINDEX, TROPONINI in the last 168 hours. BNP (last 3 results) No results for input(s): PROBNP in the last 8760 hours. HbA1C: No results for input(s): HGBA1C in the last 72 hours. CBG: No results for input(s): GLUCAP in the last 168 hours. Lipid Profile: No results for input(s): CHOL, HDL, LDLCALC, TRIG, CHOLHDL, LDLDIRECT in the last 72 hours. Thyroid Function Tests: No results for input(s): TSH, T4TOTAL, FREET4, T3FREE, THYROIDAB in the last 72 hours. Anemia Panel: No results for input(s): VITAMINB12, FOLATE, FERRITIN, TIBC, IRON, RETICCTPCT in the last 72 hours. Urine analysis:    Component Value Date/Time   COLORURINE YELLOW 04/18/2012 0501   APPEARANCEUR CLEAR 04/18/2012 0501   LABSPEC 1.013 04/18/2012 0501   PHURINE 7.0 04/18/2012 0501   GLUCOSEU NEGATIVE 04/18/2012 0501   HGBUR NEGATIVE 04/18/2012 0501   BILIRUBINUR NEGATIVE 04/18/2012 0501   KETONESUR NEGATIVE 04/18/2012 0501   PROTEINUR NEGATIVE 04/18/2012 0501   UROBILINOGEN 0.2 04/18/2012 0501   NITRITE NEGATIVE 04/18/2012 0501   LEUKOCYTESUR NEGATIVE 04/18/2012 0501    Creatinine Clearance: CrCl cannot be calculated (Unknown ideal weight.).  Sepsis Labs: @LABRCNTIP (procalcitonin:4,lacticidven:4) )No results found for this or any previous visit (from the past 240 hour(s)).   Radiological Exams on Admission: Dg Ankle Complete Left  Result Date: 09/06/2017 CLINICAL DATA:  71 y/o  M; fall and pain. EXAM: LEFT ANKLE COMPLETE - 3+ VIEW COMPARISON:  None. FINDINGS: Oblique lucency of fibula superior tibial plafond may represent nondisplaced fracture. Talar dome is intact. Ankle mortise  is symmetric on these nonstress views. Vascular calcifications. IMPRESSION: Possible nondisplaced oblique fracture of fibula superior to tibial plafond, correlation for focal tenderness recommended. No other fracture or dislocation identified. Electronically Signed   By: Kristine Garbe M.D.   On: 09/06/2017 02:52   Dg Chest Port 1 View  Result Date: 09/06/2017 CLINICAL DATA:  72 y/o  M; hypotension and bradycardia. EXAM: PORTABLE CHEST 1  VIEW COMPARISON:  10/01/2011 chest radiograph FINDINGS: Stable heart size and mediastinal contours are within normal limits. Both lungs are clear. The visualized skeletal structures are unremarkable. IMPRESSION: No active disease. Electronically Signed   By: Kristine Garbe M.D.   On: 09/06/2017 05:59   Dg Knee Complete 4 Views Left  Result Date: 09/06/2017 CLINICAL DATA:  71 y/o  M; fall with pain. EXAM: LEFT KNEE - COMPLETE 4+ VIEW COMPARISON:  None. FINDINGS: No acute fracture or dislocation identified. Moderate joint effusion. Mild bilateral femorotibial compartment joint space narrowing and small tricompartmental osteophytes. IMPRESSION: 1.  No acute fracture or dislocation identified. 2. Moderate joint effusion. 3. Mild tricompartmental osteoarthrosis. Electronically Signed   By: Kristine Garbe M.D.   On: 09/06/2017 02:49    EKG: Independently reviewed. Sinus bradycardia Baseline wander in lead(s) V3  Assessment/Plan Principal Problem:   Bradycardia Active Problems:   Lightheadedness   Hypertension, uncontrolled   Hypotension   Fibula fracture   #1. Bradycardia/hypotension. Etiology uncertain. Patient does state his antihypertensive medications have been adjusted several times over the last 2 years due to significant weight loss and less need. Currently he is on lisinopril 40 mg amlodipine was discontinued 8 months ago. He has not been asymptomatic until today in the waiting room. Chest x-ray with no active disease. EKG with  sinus bradycardia. Initial troponin negative. Echo done last year with an EF of 60% no LVH. No metabolic derangements -Admit to telemetry -IV fluids -Hold lisinopril -Cycle troponin -Serial EKG -TSH -Follow urinalysis -Orthostatic vital signs -Monitor -if no improvement consider cards consult   #2. Fibula fracture. Related to mechanical fall. X-rays noted above. -Supportive therapy -Appreciate orthopedic assistance -PT for instruction on management of splint -OP follow up with ortho  #3. Hypertension. See #1. I medications include lisinopril. -Hold lisinopril for now -Every 4 vital signs 3. -Discharge may need to have lower lisinopril dose    DVT prophylaxis: lovenox Code Status: full code  Family Communication: wife at bedside  Disposition Plan: home hopefully  Consults called: orthopedics Admission status: inpatient    Radene Gunning MD [facility]  If 7PM-7AM, please contact night-coverage [email or web address] Password [email or web address]  09/06/2017, 8:09 AM

## 2017-09-07 ENCOUNTER — Observation Stay (HOSPITAL_BASED_OUTPATIENT_CLINIC_OR_DEPARTMENT_OTHER): Payer: Medicare Other

## 2017-09-07 DIAGNOSIS — IMO0001 Reserved for inherently not codable concepts without codable children: Secondary | ICD-10-CM

## 2017-09-07 DIAGNOSIS — R9431 Abnormal electrocardiogram [ECG] [EKG]: Secondary | ICD-10-CM | POA: Diagnosis not present

## 2017-09-07 DIAGNOSIS — S82435A Nondisplaced oblique fracture of shaft of left fibula, initial encounter for closed fracture: Secondary | ICD-10-CM | POA: Diagnosis not present

## 2017-09-07 DIAGNOSIS — I959 Hypotension, unspecified: Secondary | ICD-10-CM | POA: Diagnosis not present

## 2017-09-07 DIAGNOSIS — R55 Syncope and collapse: Secondary | ICD-10-CM | POA: Diagnosis not present

## 2017-09-07 DIAGNOSIS — S82832A Other fracture of upper and lower end of left fibula, initial encounter for closed fracture: Secondary | ICD-10-CM

## 2017-09-07 DIAGNOSIS — R42 Dizziness and giddiness: Secondary | ICD-10-CM | POA: Diagnosis not present

## 2017-09-07 DIAGNOSIS — I10 Essential (primary) hypertension: Secondary | ICD-10-CM | POA: Diagnosis not present

## 2017-09-07 DIAGNOSIS — Z0389 Encounter for observation for other suspected diseases and conditions ruled out: Secondary | ICD-10-CM

## 2017-09-07 DIAGNOSIS — R001 Bradycardia, unspecified: Secondary | ICD-10-CM | POA: Diagnosis not present

## 2017-09-07 LAB — ECHOCARDIOGRAM COMPLETE
E decel time: 180 msec
EERAT: 5.91
FS: 38 % (ref 28–44)
Height: 73 in
IVS/LV PW RATIO, ED: 1.09
LA ID, A-P, ES: 41 mm
LA diam end sys: 41 mm
LA diam index: 1.8 cm/m2
LA vol index: 24.4 mL/m2
LA vol: 55.6 mL
LAVOLA4C: 43.5 mL
LDCA: 3.46 cm2
LV E/e'average: 5.91
LV TDI E'LATERAL: 14.1
LV TDI E'MEDIAL: 12.4
LVEEMED: 5.91
LVELAT: 14.1 cm/s
LVOT SV: 93 mL
LVOT VTI: 27 cm
LVOT diameter: 21 mm
LVOT peak grad rest: 8 mmHg
LVOTPV: 145 cm/s
Lateral S' vel: 15.8 cm/s
MV Dec: 180
MV pk E vel: 83.4 m/s
MVPG: 3 mmHg
MVPKAVEL: 55.3 m/s
PW: 11.5 mm — AB (ref 0.6–1.1)
RV TAPSE: 23.5 mm
WEIGHTICAEL: 3495.61 [oz_av]

## 2017-09-07 LAB — CBC
HEMATOCRIT: 35.7 % — AB (ref 39.0–52.0)
HEMOGLOBIN: 11.6 g/dL — AB (ref 13.0–17.0)
MCH: 29.1 pg (ref 26.0–34.0)
MCHC: 32.5 g/dL (ref 30.0–36.0)
MCV: 89.7 fL (ref 78.0–100.0)
Platelets: 153 10*3/uL (ref 150–400)
RBC: 3.98 MIL/uL — ABNORMAL LOW (ref 4.22–5.81)
RDW: 14.3 % (ref 11.5–15.5)
WBC: 6.3 10*3/uL (ref 4.0–10.5)

## 2017-09-07 LAB — BASIC METABOLIC PANEL
ANION GAP: 5 (ref 5–15)
BUN: 10 mg/dL (ref 6–20)
CALCIUM: 8.3 mg/dL — AB (ref 8.9–10.3)
CO2: 25 mmol/L (ref 22–32)
Chloride: 108 mmol/L (ref 101–111)
Creatinine, Ser: 1.21 mg/dL (ref 0.61–1.24)
GFR calc Af Amer: >60 mL/min (ref >60)
GFR calc non Af Amer: 59 mL/min — ABNORMAL LOW (ref >60)
GLUCOSE: 115 mg/dL — AB (ref 65–99)
Potassium: 3.6 mmol/L (ref 3.5–5.1)
Sodium: 138 mmol/L (ref 135–145)

## 2017-09-07 LAB — URIC ACID: URIC ACID, SERUM: 5.2 mg/dL (ref 4.4–7.6)

## 2017-09-07 MED ORDER — ASPIRIN EC 81 MG PO TBEC
81.0000 mg | DELAYED_RELEASE_TABLET | Freq: Every day | ORAL | Status: DC
  Administered 2017-09-07 – 2017-09-08 (×2): 81 mg via ORAL
  Filled 2017-09-07 (×2): qty 1

## 2017-09-07 NOTE — Progress Notes (Signed)
  Echocardiogram 2D Echocardiogram has been performed.  Dalton Martin 09/07/2017, 2:09 PM

## 2017-09-07 NOTE — Progress Notes (Signed)
PROGRESS NOTE    DONIEL MAIELLO  VPX:[id number] DOB: 11-13-1945 DOA: 09/06/2017 PCP: Default, Provider, MD   Brief Narrative: Dalton Martin is a very pleasant 71 y.o. male with medical history significant for HTN, hyperlipidemia and obesity presents to the emergency Department chief complaint pain in his left ankle after a mechanical fall. Initial evaluation reveals hypotension, bradycardia and fibula fracture. [facility] are asked to admit  Assessment & Plan:   Principal Problem:   Bradycardia Active Problems:   Lightheadedness   Hypertension, uncontrolled   Hypotension   Fibula fracture   1-Bradycardia, Hypotension; Had symptomatic episode in the ED, could have be vaso-vagal.  He is still bradycardic HR in the 50. He has not ambulated yet.  Cardiology consulted.  Troponin times 2 negative.  ECHO ordered.   2-Left Fibula Fracture; discussed with Dr.  Erlinda Hong, out patient follow up in 2 weeks.  Keep splint, no weight baring.  Start aspirin.   3-HTN; hold lisinopril.   4-left knee pain;  X ray; with effusion. Check uric acid.  Out pateint arthrocentesis.   DVT prophylaxis: lovenox.  Code Status: full code.  Family Communication; care discussed with patient.  Disposition Plan: Cardiology evaluation, PT consulted.   Consultants:   Cardiology    Procedures: ECHO   Antimicrobials: none   Subjective: He denies chest pain, dyspnea.  He report pain right knee with movement, bending.   Objective: Vitals:   09/06/17 1753 09/06/17 2048 09/07/17 0014 09/07/17 0557  BP: (!) 142/56 132/64 129/69 (!) 110/52  Pulse: 66 67 67 63  Resp: 18 18 18 18   Temp: 98.5 F (36.9 C) 98.5 F (36.9 C) 97.7 F (36.5 C) 98.3 F (36.8 C)  TempSrc: Oral Oral Oral Oral  SpO2: 100% 95% 94% 96%  Weight:    99.1 kg (218 lb 7.6 oz)  Height:        Intake/Output Summary (Last 24 hours) at 09/07/2017 0833 Last data filed at 09/07/2017 0327 Gross per 24 hour  Intake 1516.25 ml    Output 1000 ml  Net 516.25 ml   Filed Weights   09/06/17 1000 09/07/17 0557  Weight: 96.6 kg (212 lb 15.4 oz) 99.1 kg (218 lb 7.6 oz)    Examination:  General exam: Appears calm and comfortable  Respiratory system: Clear to auscultation. Respiratory effort normal. Cardiovascular system: S1 & S2 heard, RRR. No JVD, murmurs, rubs, gallops or clicks. No pedal edema. Gastrointestinal system: Abdomen is nondistended, soft and nontender. No organomegaly or masses felt. Normal bowel sounds heard. Central nervous system: Alert and oriented. No focal neurological deficits. Extremities: left LE on splint.  Skin: No rashes, lesions or ulcers    Data Reviewed: I have personally reviewed following labs and imaging studies  CBC: Recent Labs  Lab 09/06/17 0507 09/07/17 0445  WBC 8.4 6.3  NEUTROABS 6.2  --   HGB 13.0 11.6*  HCT 38.8* 35.7*  MCV 88.6 89.7  PLT 176 462   Basic Metabolic Panel: Recent Labs  Lab 09/06/17 0507 09/07/17 0445  NA 137 138  K 3.7 3.6  CL 104 108  CO2 24 25  GLUCOSE 132* 115*  BUN 9 10  CREATININE 1.16 1.21  CALCIUM 8.8* 8.3*  MG 2.0  --    GFR: Estimated Creatinine Clearance: 71.4 mL/min (by C-G formula based on SCr of 1.21 mg/dL). Liver Function Tests: No results for input(s): AST, ALT, ALKPHOS, BILITOT, PROT, ALBUMIN in the last 168 hours. No results for input(s): LIPASE, AMYLASE in  the last 168 hours. No results for input(s): AMMONIA in the last 168 hours. Coagulation Profile: No results for input(s): INR, PROTIME in the last 168 hours. Cardiac Enzymes: Recent Labs  Lab 09/06/17 0850  TROPONINI <0.03   BNP (last 3 results) No results for input(s): PROBNP in the last 8760 hours. HbA1C: No results for input(s): HGBA1C in the last 72 hours. CBG: No results for input(s): GLUCAP in the last 168 hours. Lipid Profile: No results for input(s): CHOL, HDL, LDLCALC, TRIG, CHOLHDL, LDLDIRECT in the last 72 hours. Thyroid Function Tests: Recent  Labs    09/06/17 0849  TSH 0.378   Anemia Panel: No results for input(s): VITAMINB12, FOLATE, FERRITIN, TIBC, IRON, RETICCTPCT in the last 72 hours. Sepsis Labs: No results for input(s): PROCALCITON, LATICACIDVEN in the last 168 hours.  No results found for this or any previous visit (from the past 240 hour(s)).       Radiology Studies: Dg Ankle Complete Left  Result Date: 09/06/2017 CLINICAL DATA:  71 y/o  M; fall and pain. EXAM: LEFT ANKLE COMPLETE - 3+ VIEW COMPARISON:  None. FINDINGS: Oblique lucency of fibula superior tibial plafond may represent nondisplaced fracture. Talar dome is intact. Ankle mortise is symmetric on these nonstress views. Vascular calcifications. IMPRESSION: Possible nondisplaced oblique fracture of fibula superior to tibial plafond, correlation for focal tenderness recommended. No other fracture or dislocation identified. Electronically Signed   By: Kristine Garbe M.D.   On: 09/06/2017 02:52   Dg Chest Port 1 View  Result Date: 09/06/2017 CLINICAL DATA:  71 y/o  M; hypotension and bradycardia. EXAM: PORTABLE CHEST 1 VIEW COMPARISON:  10/01/2011 chest radiograph FINDINGS: Stable heart size and mediastinal contours are within normal limits. Both lungs are clear. The visualized skeletal structures are unremarkable. IMPRESSION: No active disease. Electronically Signed   By: Kristine Garbe M.D.   On: 09/06/2017 05:59   Dg Knee Complete 4 Views Left  Result Date: 09/06/2017 CLINICAL DATA:  71 y/o  M; fall with pain. EXAM: LEFT KNEE - COMPLETE 4+ VIEW COMPARISON:  None. FINDINGS: No acute fracture or dislocation identified. Moderate joint effusion. Mild bilateral femorotibial compartment joint space narrowing and small tricompartmental osteophytes. IMPRESSION: 1.  No acute fracture or dislocation identified. 2. Moderate joint effusion. 3. Mild tricompartmental osteoarthrosis. Electronically Signed   By: Kristine Garbe M.D.   On: 09/06/2017  02:49        Scheduled Meds: . cholecalciferol  1,000 Units Oral Daily  . enoxaparin (LOVENOX) injection  40 mg Subcutaneous Q24H  . loratadine  10 mg Oral Daily  . vitamin B-12  1,000 mcg Oral Daily   Continuous Infusions:   LOS: 1 day    Time spent: 35 minutes.     Elmarie Shiley, MD [facility] Pager [phone]  If 7PM-7AM, please contact night-coverage [email or web address] Password [facility] 09/07/2017, 8:33 AM

## 2017-09-07 NOTE — Discharge Instructions (Signed)
Call [facility] number at [phone] to find a PCP in your network.

## 2017-09-07 NOTE — Plan of Care (Signed)
Patient stable during 7 a to 7 p, HR largely in 50's and 60's, patient asymptomatic during periods of bradycardia.  Received one Norco this shift for pain in left leg, worked with physical therapy on transfers.
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
## 2017-09-07 NOTE — Care Management Obs Status (Signed)
MEDICARE OBSERVATION STATUS NOTIFICATION   Patient Details  Name: Dalton Martin MRN: [id number] Date of Birth: 01-19-1946   Medicare Observation Status Notification Given:  Yes    Arley Phenix, RN 09/07/2017, 9:24 AM

## 2017-09-07 NOTE — Plan of Care (Signed)
  Clinical Measurements: Ability to maintain clinical measurements within normal limits will improve 09/07/2017 0413 - Completed/Met by Garnett-Mellinger, Sophronia Simas, RN

## 2017-09-07 NOTE — Evaluation (Signed)
Physical Therapy Evaluation Patient Details Name: Dalton Martin MRN: [id number] DOB: Aug 03, 1946 Today's Date: 09/07/2017   History of Present Illness  Patient is a 71 yo male admitted 09/06/17 after he slipped on a chain that was on the floor. He fracture his Lt fibula. In the ED waiting room he became lightheaded, nauseous, and diaphoretic.  VS showed a HR in the 22'W and a systolic B/P of 68. This apparently resolved pretty quickly.  Since admission his HR has been NSR- 40-75. B/P 979-892 systolic.   PMH:  HTN, HLD, OSA on CPAP, HOH and wears hearing aids  Clinical Impression  Patient presents with problems listed below.  Will benefit from acute PT to maximize functional independence prior to discharge.  Patient requiring min assist for mobility and gait.  Able to maintain NWB status during mobility and gait. Recommend f/u HHPT at discharge for continued therapy.      Follow Up Recommendations Home health PT;Supervision for mobility/OOB    Equipment Recommendations  Rolling walker with 5" wheels;Crutches(RW or crutches - TBD at next visit)    Recommendations for Other Services       Precautions / Restrictions Precautions Precautions: Fall Restrictions Weight Bearing Restrictions: Yes LLE Weight Bearing: Non weight bearing      Mobility  Bed Mobility Overal bed mobility: Needs Assistance Bed Mobility: Supine to Sit;Sit to Supine     Supine to sit: Min assist Sit to supine: Min assist   General bed mobility comments: Assist to manage LLE off of and onto bed.  Transfers Overall transfer level: Needs assistance Equipment used: Rolling walker (2 wheeled) Transfers: Sit to/from Stand Sit to Stand: Min assist;From elevated surface         General transfer comment: Verbal cues for hand placement and NWB on LLE.  Assist to rise from lower surface, and to steady during transfers.  Ambulation/Gait Ambulation/Gait assistance: Min assist Ambulation Distance (Feet): 24  Feet Assistive device: Rolling walker (2 wheeled) Gait Pattern/deviations: Step-to pattern(Hop-to on RLE) Gait velocity: decreased Gait velocity interpretation: Below normal speed for age/gender General Gait Details: Verbal cues for safe use of RW, and NWB on LLE.  Patient taking short hopping steps on RLE, with heavy use of UE's on RW.  Fatigued quickly.  Stairs            Wheelchair Mobility    Modified Rankin (Stroke Patients Only)       Balance Overall balance assessment: Needs assistance Sitting-balance support: No upper extremity supported;Feet supported Sitting balance-Leahy Scale: Fair     Standing balance support: Bilateral upper extremity supported Standing balance-Leahy Scale: Poor                               Pertinent Vitals/Pain Pain Assessment: 0-10 Pain Score: 5  Pain Location: Lt ankle and posterior knee Pain Descriptors / Indicators: Aching;Cramping Pain Intervention(s): Limited activity within patient's tolerance;Monitored during session;Repositioned    Home Living Family/patient expects to be discharged to:: Private residence Living Arrangements: Spouse/significant other Available Help at Discharge: Family;Available 24 hours/day(24 hours/day initially) Type of Home: Apartment Home Access: Level entry     Home Layout: One level Home Equipment: Cane - single point;Shower seat      Prior Function Level of Independence: Independent         Comments: Retired.  Now works as Teacher, early years/pre.     Hand Dominance        Extremity/Trunk Assessment  Upper Extremity Assessment Upper Extremity Assessment: Overall WFL for tasks assessed;Defer to OT evaluation    Lower Extremity Assessment Lower Extremity Assessment: LLE deficits/detail LLE Deficits / Details: Splint on lower leg, bandaged.  Patient able to move toes.  Difficulty with knee flexion, to approx 40* only due to pain in posterior knee. LLE: Unable to fully assess  due to pain;Unable to fully assess due to immobilization LLE Coordination: decreased gross motor    Cervical / Trunk Assessment Cervical / Trunk Assessment: Normal  Communication   Communication: No difficulties  Cognition Arousal/Alertness: Awake/alert Behavior During Therapy: WFL for tasks assessed/performed Overall Cognitive Status: Within Functional Limits for tasks assessed                                        General Comments      Exercises     Assessment/Plan    PT Assessment Patient needs continued PT services  PT Problem List Decreased strength;Decreased range of motion;Decreased activity tolerance;Decreased balance;Decreased mobility;Decreased knowledge of use of DME;Decreased knowledge of precautions;Cardiopulmonary status limiting activity;Pain       PT Treatment Interventions DME instruction;Gait training;Functional mobility training;Therapeutic activities;Therapeutic exercise;Patient/family education    PT Goals (Current goals can be found in the Care Plan section)  Acute Rehab PT Goals Patient Stated Goal: To return home PT Goal Formulation: With patient Time For Goal Achievement: 09/14/17 Potential to Achieve Goals: Fair    Frequency Min 4X/week   Barriers to discharge Decreased caregiver support Wife works during day.  Will be able to be with patient 24* initially.    Co-evaluation               AM-PAC PT "6 Clicks" Daily Activity  Outcome Measure Difficulty turning over in bed (including adjusting bedclothes, sheets and blankets)?: A Little Difficulty moving from lying on back to sitting on the side of the bed? : Unable Difficulty sitting down on and standing up from a chair with arms (e.g., wheelchair, bedside commode, etc,.)?: Unable Help needed moving to and from a bed to chair (including a wheelchair)?: A Little Help needed walking in hospital room?: A Little Help needed climbing 3-5 steps with a railing? : A Lot 6  Click Score: 13    End of Session Equipment Utilized During Treatment: Gait belt Activity Tolerance: Patient limited by fatigue;Patient limited by pain Patient left: in bed;with call bell/phone within reach;with nursing/sitter in room(LLE elevated) Nurse Communication: Mobility status;Patient requests pain meds PT Visit Diagnosis: Other abnormalities of gait and mobility (R26.89);Pain Pain - Right/Left: Left Pain - part of body: Ankle and joints of foot;Leg    Time: 0867-6195 PT Time Calculation (min) (ACUTE ONLY): 27 min   Charges:   PT Evaluation $PT Eval Moderate Complexity: 1 Mod PT Treatments $Gait Training: 8-22 mins   PT G Codes:   PT G-Codes **NOT FOR INPATIENT CLASS** Functional Assessment Tool Used: AM-PAC 6 Clicks Basic Mobility Functional Limitation: Mobility: Walking and moving around Mobility: Walking and Moving Around Current Status (K9326): At least 40 percent but less than 60 percent impaired, limited or restricted Mobility: Walking and Moving Around Goal Status [phone]): At least 1 percent but less than 20 percent impaired, limited or restricted    Carita Pian. Sanjuana Kava, [facility] Acute Rehab Services Pager [place] 09/07/2017, 7:10 PM

## 2017-09-07 NOTE — Care Management Note (Signed)
Case Management Note  Patient Details  Name: PAVEL GADD MRN: [id number] Date of Birth: [birth year]  Subjective/Objective:      Pt presented for bradycardia, hypotension, and fibula fracture after fall. Pt independent PTA. Pt from home with wife. CM consult to help find PMD.  Pt has [employer] and [employer] supplement.  Pt states he doesn't have a PMD because he has not liked the ones he has seen in the last year or has not liked the office staff.  Pt states he has refills on prescriptions and has no trouble filling prescriptions.  Pt is aware of need for PMD and states he will keep trying to find one he likes.              Action/Plan: Advised pt he can utilize [facility] number on AVS or 1-800 numbers on his insurance cards.  Emphasized importance of establishing care and pt verbalized understanding.    PT to work with pt today since pt is non-wt bearing on fractured leg.  CM will follow for equipment needs.  Expected Discharge Date:                  Expected Discharge Plan:  Home/Self Care  In-House Referral:  PCP / [facility]  Discharge planning Services  CM Consult  Post Acute Care Choice:  NA Choice offered to:     DME Arranged:    DME Agency:     HH Arranged:    HH Agency:     Status of Service:  In Process, will continue to follow.  If discussed at [place] of Stay Meetings, dates discussed:    Additional Comments:  Arley Phenix, RN 09/07/2017, 9:30 AM

## 2017-09-07 NOTE — Progress Notes (Signed)
Ankle and knee xrays reviewed.  Recommend NWB LLE, crutches, walker, or wheelchair for ambulation.  PT eval.  Follow up in office in 1-2 weeks.    Dalton Cecil, MD [place] 10:35 AM

## 2017-09-08 DIAGNOSIS — R001 Bradycardia, unspecified: Secondary | ICD-10-CM | POA: Diagnosis not present

## 2017-09-08 MED ORDER — ASPIRIN 81 MG PO TBEC: 81.0000 mg | DELAYED_RELEASE_TABLET | Freq: Every day | ORAL | 0 refills | Status: AC

## 2017-09-08 MED ORDER — POLYETHYLENE GLYCOL 3350 17 G PO PACK: 17.0000 g | PACK | Freq: Every day | ORAL | 0 refills | Status: DC

## 2017-09-08 MED ORDER — SENNOSIDES-DOCUSATE SODIUM 8.6-50 MG PO TABS: 1.0000 | ORAL_TABLET | Freq: Every evening | ORAL | 0 refills | Status: AC | PRN

## 2017-09-08 MED ORDER — HYDROCODONE-ACETAMINOPHEN 5-325 MG PO TABS: 1.0000 | ORAL_TABLET | ORAL | 0 refills | Status: DC | PRN

## 2017-09-08 NOTE — Evaluation (Signed)
Occupational Therapy Evaluation Patient Details Name: Dalton Martin MRN: [id number] DOB: [birth year] Today's Date: 09/08/2017    History of Present Illness Patient is a 71 yo male admitted 09/06/17 after he slipped on a chain that was on the floor. He fracture his Lt fibula. In the ED waiting room he became lightheaded, nauseous, and diaphoretic.  VS showed a HR in the 25'Z and a systolic B/P of 68. This apparently resolved pretty quickly.  Since admission his HR has been NSR- 40-75. B/P 563-875 systolic.   PMH:  HTN, HLD, OSA on CPAP, HOH and wears hearing aids   Clinical Impression   OT education complete. Pt will need a 3 n 1 for home use    Follow Up Recommendations  No OT follow up    Equipment Recommendations  3 in 1 bedside commode    Recommendations for Other Services       Precautions / Restrictions Precautions Precautions: Fall Restrictions Weight Bearing Restrictions: Yes LLE Weight Bearing: Non weight bearing      Mobility Bed Mobility Overal bed mobility: Needs Assistance Bed Mobility: Supine to Sit;Sit to Supine     Supine to sit: Min assist Sit to supine: Min assist   General bed mobility comments: Assist to manage LLE off of and onto bed.  Transfers Overall transfer level: Needs assistance Equipment used: Rolling walker (2 wheeled) Transfers: Sit to/from [employer] Sit to Stand: Min assist;From elevated surface         General transfer comment: Verbal cues for hand placement and NWB on LLE.  Assist to rise from lower surface, and to steady during transfers.    Balance Overall balance assessment: Needs assistance Sitting-balance support: No upper extremity supported;Feet supported Sitting balance-Leahy Scale: Fair     Standing balance support: Bilateral upper extremity supported Standing balance-Leahy Scale: Poor                             ADL either performed or assessed with clinical judgement   ADL Overall  ADL's : Needs assistance/impaired Eating/Feeding: Set up;Sitting   Grooming: Set up;Sitting   Upper Body Bathing: Set up;Sitting   Lower Body Bathing: Minimal assistance;Sit to/from stand   Upper Body Dressing : Set up;Sitting   Lower Body Dressing: Minimal assistance;Sit to/from stand;Cueing for safety;Cueing for sequencing   Toilet Transfer: Minimal assistance;RW;BSC;Stand-pivot;Cueing for sequencing;Cueing for safety   Toileting- Clothing Manipulation and Hygiene: Minimal assistance;Sit to/from stand;Cueing for sequencing         General ADL Comments: wife can A as needed                  Pertinent Vitals/Pain Pain Score: 4  Pain Location: left ankle Pain Descriptors / Indicators: Sore Pain Intervention(s): Limited activity within patient's tolerance;Monitored during session;Repositioned     Hand Dominance     Extremity/Trunk Assessment Upper Extremity Assessment Upper Extremity Assessment: Overall WFL for tasks assessed           Communication Communication Communication: No difficulties   Cognition Arousal/Alertness: Awake/alert Behavior During Therapy: WFL for tasks assessed/performed Overall Cognitive Status: Within Functional Limits for tasks assessed                                                Home Living Family/patient expects to be discharged to::  Private residence Living Arrangements: Spouse/significant other Available Help at Discharge: Family;Available 24 hours/day(24 hours/day initially) Type of Home: Apartment Home Access: Level entry     Home Layout: One level     Bathroom Shower/Tub: Walk-in shower         Home Equipment: Kasandra Knudsen - single point;Shower seat          Prior Functioning/Environment Level of Independence: Independent        Comments: Retired.  Now works as Teacher, early years/pre.                 OT Goals(Current goals can be found in the care plan section) Acute Rehab OT Goals Patient  Stated Goal: To return home OT Goal Formulation: With patient  OT Frequency:      AM-PAC PT "6 Clicks" Daily Activity     Outcome Measure   Help from another person taking care of personal grooming?: None Help from another person toileting, which includes using toliet, bedpan, or urinal?: A Little Help from another person bathing (including washing, rinsing, drying)?: A Little Help from another person to put on and taking off regular upper body clothing?: None Help from another person to put on and taking off regular lower body clothing?: A Little 6 Click Score: 17   End of Session Nurse Communication: Mobility status  Activity Tolerance: Patient tolerated treatment well Patient left: with call bell/phone within reach                   Time: 1102-1127 OT Time Calculation (min): 25 min Charges:  OT General Charges $OT Visit: 1 Visit OT Evaluation $OT Eval Moderate Complexity: 1 Mod OT Treatments $Self Care/Home Management : 8-22 mins G-Codes:     Kari Baars, OT [phone]  Payton Mccallum D 09/08/2017, 11:54 AM

## 2017-09-08 NOTE — Progress Notes (Signed)
Patient's heart rate dropped down to 39. Patient is asymptomatic and MD was notified. Will continue to monitor.   Drue Flirt, RN

## 2017-09-08 NOTE — Discharge Summary (Signed)
Physician Discharge Summary  KAHLEB MCCLANE UYQ:[id number] DOB: 11/30/1945 DOA: 09/06/2017  PCP: Default, Provider, MD  Admit date: 09/06/2017 Discharge date: 09/08/2017  Admitted From: Home  Disposition:  Home  Recommendations for Outpatient Follow-up:  1. Follow up with PCP in 1-2 weeks 2. Please obtain BMP/CBC in one week 3. Follow up with cardiology for consideration of holter.   Home Health: yes.   Discharge Condition: Stable.  CODE STATUS: full code.  Diet recommendation: Heart Healthy   Brief/Interim Summary: Dalton Martin a very pleasant71 y.o.malewith medical history significantforHTN, hyperlipidemia and obesity presents to the emergency Department chief complaint pain in his left ankle after a mechanical fall. Initial evaluation reveals hypotension, bradycardia and fibula fracture. [facility] are asked to admit  Assessment & Plan:   Principal Problem:   Bradycardia Active Problems:   Lightheadedness   Hypertension, uncontrolled   Hypotension   Fibula fracture   1-Bradycardia, Hypotension; Had symptomatic episode in the ED, could have be vaso-vagal.  Cardiology consulted. Outpatient holter monitor.  Troponin times 2 negative.  ECHO Normal EF.   2-Left Fibula Fracture; discussed with Dr.  Erlinda Hong, out patient follow up in 2 weeks.  Keep splint, no weight baring.  Started  aspirin.  will provide short course of pain medications.   3-HTN; resume  lisinopril. BP increasing.   4-left knee pain;  X ray; with effusion. Check uric acid.  Out pateint arthrocentesis.     Discharge Diagnoses:  Principal Problem:   Bradycardia Active Problems:   Lightheadedness   Hypertension, uncontrolled   Hypotension   Fibula fracture   Normal coronary arteries    Discharge Instructions  Discharge Instructions    Diet - low sodium heart healthy   Complete by:  As directed      Allergies as of 09/08/2017   No Known Allergies     Medication  List    STOP taking these medications   ibuprofen 800 MG tablet Commonly known as:  ADVIL,MOTRIN     TAKE these medications   aspirin 81 MG EC tablet Take 1 tablet (81 mg total) by mouth daily. Start taking on:  09/09/2017   BIOFREEZE EX Apply 1 application topically daily as needed (pain).   cetirizine 10 MG tablet Commonly known as:  ZYRTEC Take 1 tablet (10 mg total) by mouth daily as needed for allergies.   cholecalciferol 1000 units tablet Commonly known as:  VITAMIN D Take 1,000 Units by mouth daily. Reported on 12/19/2015   fluticasone 0.05 % cream Commonly known as:  CUTIVATE Apply topically 2 (two) times daily. What changed:    how much to take  when to take this  reasons to take this   fluticasone 50 MCG/ACT nasal spray Commonly known as:  FLONASE Place 1 spray into both nostrils daily. What changed:    when to take this  reasons to take this   HYDROcodone-acetaminophen 5-325 MG tablet Commonly known as:  NORCO/VICODIN Take 1-2 tablets by mouth every 4 (four) hours as needed for moderate pain.   lisinopril 40 MG tablet Commonly known as:  PRINIVIL,ZESTRIL TAKE 1 TABLET BY MOUTH EVERY MORNING   MENS MULTIVITAMIN PLUS PO Take 1 tablet by mouth daily.   polyethylene glycol packet Commonly known as:  MIRALAX Take 17 g by mouth daily.   senna-docusate 8.6-50 MG tablet Commonly known as:  Senokot-S Take 1 tablet by mouth at bedtime as needed for mild constipation.   triamcinolone cream 0.1 % Commonly known as:  KENALOG  Apply 1 application topically 2 (two) times daily as needed (rash).   VITAMIN B-12 PO Take 1 tablet by mouth daily.            Durable Medical Equipment  (From admission, onward)        Start     Ordered   09/08/17 0943  For home use only DME Crutches  Once     09/08/17 3536     Follow-up Information    Leandrew Koyanagi, MD Follow up in 1 week(s).   Specialty:  Orthopedic Surgery Contact information: [place] [address] [phone]        Josue Hector, MD Follow up in 1 week(s).   Specialty:  Cardiology Contact information: [address] [place] [address] [phone]          No Known Allergies  Consultations:  Cardiology    Procedures/Studies: Dg Ankle Complete Left  Result Date: 09/06/2017 CLINICAL DATA:  71 y/o  M; fall and pain. EXAM: LEFT ANKLE COMPLETE - 3+ VIEW COMPARISON:  None. FINDINGS: Oblique lucency of fibula superior tibial plafond may represent nondisplaced fracture. Talar dome is intact. Ankle mortise is symmetric on these nonstress views. Vascular calcifications. IMPRESSION: Possible nondisplaced oblique fracture of fibula superior to tibial plafond, correlation for focal tenderness recommended. No other fracture or dislocation identified. Electronically Signed   By: Kristine Garbe M.D.   On: 09/06/2017 02:52   Dg Chest Port 1 View  Result Date: 09/06/2017 CLINICAL DATA:  71 y/o  M; hypotension and bradycardia. EXAM: PORTABLE CHEST 1 VIEW COMPARISON:  10/01/2011 chest radiograph FINDINGS: Stable heart size and mediastinal contours are within normal limits. Both lungs are clear. The visualized skeletal structures are unremarkable. IMPRESSION: No active disease. Electronically Signed   By: Kristine Garbe M.D.   On: 09/06/2017 05:59   Dg Knee Complete 4 Views Left  Result Date: 09/06/2017 CLINICAL DATA:  71 y/o  M; fall with pain. EXAM: LEFT KNEE - COMPLETE 4+ VIEW COMPARISON:  None. FINDINGS: No acute fracture or dislocation identified. Moderate joint effusion. Mild bilateral femorotibial compartment joint space narrowing and small tricompartmental osteophytes. IMPRESSION: 1.  No acute fracture or dislocation identified. 2. Moderate joint effusion. 3. Mild tricompartmental osteoarthrosis. Electronically Signed   By: Kristine Garbe M.D.   On: 09/06/2017 02:49        Subjective: He is feeling ok, pain is controlled.   Discharge Exam: Vitals:   09/07/17 1957 09/08/17 0347  BP: 126/70 (!) 146/58  Pulse: 67 (!) 54  Resp: 20   Temp: 98.6 F (37 C) 98.3 F (36.8 C)  SpO2: 96% 96%   Vitals:   09/07/17 0557 09/07/17 1300 09/07/17 1957 09/08/17 0347  BP: (!) 110/52 (!) 150/72 126/70 (!) 146/58  Pulse: 63 (!) 57 67 (!) 54  Resp: 18 18 20    Temp: 98.3 F (36.8 C) 98.3 F (36.8 C) 98.6 F (37 C) 98.3 F (36.8 C)  TempSrc: Oral Oral Oral Oral  SpO2: 96% 97% 96% 96%  Weight: 99.1 kg (218 lb 7.6 oz)   99.1 kg (218 lb 7.6 oz)  Height:        General: Pt is alert, awake, not in acute distress Cardiovascular: RRR, S1/S2 +, no rubs, no gallops Respiratory: CTA bilaterally, no wheezing, no rhonchi Abdominal: Soft, NT, ND, bowel sounds + Extremities: no edema, no cyanosis, left LE on splint.     The results of significant diagnostics from this  hospitalization (including imaging, microbiology, ancillary and laboratory) are listed below for reference.     Microbiology: No results found for this or any previous visit (from the past 240 hour(s)).   Labs: BNP (last 3 results) No results for input(s): BNP in the last 8760 hours. Basic Metabolic Panel: Recent Labs  Lab 09/06/17 0507 09/07/17 0445  NA 137 138  K 3.7 3.6  CL 104 108  CO2 24 25  GLUCOSE 132* 115*  BUN 9 10  CREATININE 1.16 1.21  CALCIUM 8.8* 8.3*  MG 2.0  --    Liver Function Tests: No results for input(s): AST, ALT, ALKPHOS, BILITOT, PROT, ALBUMIN in the last 168 hours. No results for input(s): LIPASE, AMYLASE in the last 168 hours. No results for input(s): AMMONIA in the last 168 hours. CBC: Recent Labs  Lab 09/06/17 0507 09/07/17 0445  WBC 8.4 6.3  NEUTROABS 6.2  --   HGB 13.0 11.6*  HCT 38.8* 35.7*  MCV 88.6 89.7  PLT 176 153   Cardiac Enzymes: Recent Labs  Lab 09/06/17 0850  TROPONINI <0.03   BNP: Invalid input(s): POCBNP CBG: No results  for input(s): GLUCAP in the last 168 hours. D-Dimer No results for input(s): DDIMER in the last 72 hours. Hgb A1c No results for input(s): HGBA1C in the last 72 hours. Lipid Profile No results for input(s): CHOL, HDL, LDLCALC, TRIG, CHOLHDL, LDLDIRECT in the last 72 hours. Thyroid function studies Recent Labs    09/06/17 0849  TSH 0.378   Anemia work up No results for input(s): VITAMINB12, FOLATE, FERRITIN, TIBC, IRON, RETICCTPCT in the last 72 hours. Urinalysis    Component Value Date/Time   COLORURINE YELLOW 09/06/2017 1056   APPEARANCEUR CLEAR 09/06/2017 1056   LABSPEC 1.013 09/06/2017 1056   PHURINE 6.0 09/06/2017 1056   GLUCOSEU NEGATIVE 09/06/2017 1056   HGBUR NEGATIVE 09/06/2017 [place] 09/06/2017 1056   KETONESUR NEGATIVE 09/06/2017 1056   PROTEINUR NEGATIVE 09/06/2017 1056   UROBILINOGEN 0.2 04/18/2012 0501   NITRITE NEGATIVE 09/06/2017 1056   LEUKOCYTESUR NEGATIVE 09/06/2017 1056   Sepsis Labs Invalid input(s): PROCALCITONIN,  WBC,  LACTICIDVEN Microbiology No results found for this or any previous visit (from the past 240 hour(s)).   Time coordinating discharge: Over 30 minutes  SIGNED:   Elmarie Shiley, MD  [facility] 09/08/2017, 9:55 AM  Pager   If 7PM-7AM, please contact night-coverage [email or web address] Password [email or web address]

## 2017-09-08 NOTE — Progress Notes (Addendum)
DME ordered as requested - wheelchair, crutches and 3:1 and to be delivered to the patient's room today prior to discharging home; Aneta Mins [phone]

## 2017-09-08 NOTE — Care Management Note (Signed)
Case Management Note  Patient Details  Name: Dalton Martin MRN: [id number] Date of Birth: 1946-02-03                Action/Plan: CM talked to patient about [place] choices, pt chose [facility]; Tim with [facility] called for arrangements.  Expected Discharge Date:  09/08/17               Expected Discharge Plan:  Home/Self Care  In-House Referral:  PCP / [facility]  Discharge planning Services  CM Consult  Post Acute Care Choice:  NA Choice offered to:     [facility] Arranged:  PT [place]:  [facility] [place] [facility])  Status of Service:  Completed, signed off  Sherrilyn Rist [phone] 09/08/2017, 9:55 AM

## 2017-09-08 NOTE — Progress Notes (Signed)
Patient ready for discharge from unit. Wife  present to provide transportation. All d/c instructions reviewed with patient and wife. Follow up instructions also reviewed. Patient offers no c/o. No  distress noted.  Patient reminded to make follow up appts. Rx given to patient All personal belongings with pt.

## 2017-09-08 NOTE — Progress Notes (Signed)
Progress Note  Patient Name: Dalton Martin Date of Encounter: 09/08/2017  Primary Cardiologist: Bronson Ing  Subjective   No cardiac symptoms   Inpatient Medications    Scheduled Meds: . aspirin EC  81 mg Oral Daily  . cholecalciferol  1,000 Units Oral Daily  . enoxaparin (LOVENOX) injection  40 mg Subcutaneous Q24H  . loratadine  10 mg Oral Daily  . vitamin B-12  1,000 mcg Oral Daily   Continuous Infusions:  PRN Meds: acetaminophen **OR** acetaminophen, fluticasone, HYDROcodone-acetaminophen, ondansetron **OR** ondansetron (ZOFRAN) IV, senna-docusate   Vital Signs    Vitals:   09/07/17 0557 09/07/17 1300 09/07/17 1957 09/08/17 0347  BP: (!) 110/52 (!) 150/72 126/70 (!) 146/58  Pulse: 63 (!) 57 67 (!) 54  Resp: 18 18 20    Temp: 98.3 F (36.8 C) 98.3 F (36.8 C) 98.6 F (37 C) 98.3 F (36.8 C)  TempSrc: Oral Oral Oral Oral  SpO2: 96% 97% 96% 96%  Weight: 218 lb 7.6 oz (99.1 kg)   218 lb 7.6 oz (99.1 kg)  Height:        Intake/Output Summary (Last 24 hours) at 09/08/2017 0847 Last data filed at 09/08/2017 0631 Gross per 24 hour  Intake 120 ml  Output 1790 ml  Net -1670 ml   Filed Weights   09/06/17 1000 09/07/17 0557 09/08/17 0347  Weight: 212 lb 15.4 oz (96.6 kg) 218 lb 7.6 oz (99.1 kg) 218 lb 7.6 oz (99.1 kg)    Telemetry    NSR rate 80 - Personally Reviewed  ECG    SR rate 57 normal  - Personally Reviewed  Physical Exam   GEN: No acute distress.   Neck: No JVD Cardiac: RRR, no murmurs, rubs, or gallops.  Respiratory: Clear to auscultation bilaterally. GI: Soft, nontender, non-distended  MS: No edema; No deformity. Neuro:  Nonfocal  Psych: Normal affect   Labs    Chemistry Recent Labs  Lab 09/06/17 0507 09/07/17 0445  NA 137 138  K 3.7 3.6  CL 104 108  CO2 24 25  GLUCOSE 132* 115*  BUN 9 10  CREATININE 1.16 1.21  CALCIUM 8.8* 8.3*  GFRNONAA >60 59*  GFRAA >60 >60  ANIONGAP 9 5     Hematology Recent Labs  Lab  09/06/17 0507 09/07/17 0445  WBC 8.4 6.3  RBC 4.38 3.98*  HGB 13.0 11.6*  HCT 38.8* 35.7*  MCV 88.6 89.7  MCH 29.7 29.1  MCHC 33.5 32.5  RDW 13.9 14.3  PLT 176 153    Cardiac Enzymes Recent Labs  Lab 09/06/17 0850  TROPONINI <0.03    Recent Labs  Lab 09/06/17 0513  TROPIPOC 0.00     BNPNo results for input(s): BNP, PROBNP in the last 168 hours.   DDimer No results for input(s): DDIMER in the last 168 hours.   Radiology    No results found.  Cardiac Studies   Echo reviewed EF 60-65%  Mild LVH no significant valve disease   Patient Profile     71 y.o. male with mechanical fall and broken left fibular. Vagal episode in ER with pain With SB rates 30's no recurrence Echo normal r/o no cardiac symptoms   Assessment & Plan    1) Bradycardia: resolved vagal episode baseline ECG normal with no HB or conduction Abnormality no further cardiac w/u planned   For questions or updates, please contact [facility] Please consult [email or web address] for contact info under Cardiology/STEMI.      Signed, Jenkins Rouge, MD  09/08/2017, 8:47 AM

## 2017-09-08 NOTE — Progress Notes (Signed)
Physical Therapy Treatment Patient Details Name: Dalton Martin MRN: [id number] DOB: 11/24/1945 Today's Date: 09/08/2017    History of Present Illness Patient is a 71 yo male admitted 09/06/17 after he slipped on a chain that was on the floor. He fracture his Lt fibula. In the ED waiting room he became lightheaded, nauseous, and diaphoretic.  VS showed a HR in the 03'J and a systolic B/P of 68. This apparently resolved pretty quickly.  Since admission his HR has been NSR- 40-75. B/P 009-381 systolic.   PMH:  HTN, HLD, OSA on CPAP, HOH and wears hearing aids    PT Comments    Pt progressing slowly towards physical therapy goals. Attempted with crutches per pt's request, however pt very unsafe with crutch management and is unable to maintain NWB status on the LLE. With crutches, pt's fall risk is increased. Pt continues to struggle with maintenance of NWB on LLE with RW use as well, and recommended use of walker for transfers only. Pt will require a wheelchair with elevating L legrest for optimal safe mobility until he is better able to manage RW. Will continue to follow and progress as able per POC.   Patient suffers from L fibular fracture which impairs their ability to perform daily activities like ambulating in the home.  A walker alone will not resolve the issues with performing activities of daily living. A wheelchair will allow patient to safely perform daily activities.  The patient can self propel in the home or has a caregiver who can provide assistance.      Follow Up Recommendations  Home health PT;Supervision for mobility/OOB     Equipment Recommendations  Rolling walker with 5" wheels;Wheelchair (measurements PT);3in1 (PT)(Will need BOTH w/c and RW)    Recommendations for Other Services       Precautions / Restrictions Precautions Precautions: Fall Restrictions Weight Bearing Restrictions: Yes LLE Weight Bearing: Non weight bearing    Mobility  Bed Mobility Overal bed  mobility: Needs Assistance Bed Mobility: Supine to Sit;Sit to Supine     Supine to sit: Min assist Sit to supine: Min assist   General bed mobility comments: Assist to manage LLE off of and onto bed. Pt was educated to be as independent as possible as pt is quick to ask therapist to move his leg for him. At end of session pt was able to elevated LLE with strong RLE up onto bed with min assist.   Transfers Overall transfer level: Needs assistance Equipment used: Rolling walker (2 wheeled);Crutches Transfers: Sit to/from Stand Sit to Stand: Min assist;From elevated surface         General transfer comment: Verbal cues for hand placement and NWB on LLE.  Assist to rise from lower surface, and to steady during transfers. Initially attempted with crutches and later in session with RW.   Ambulation/Gait Ambulation/Gait assistance: Min assist Ambulation Distance (Feet): 25 Feet Assistive device: Rolling walker (2 wheeled);Crutches Gait Pattern/deviations: Step-to pattern(Hop-to on RLE) Gait velocity: decreased Gait velocity interpretation: Below normal speed for age/gender General Gait Details: Verbal cues for safe use of RW, and NWB on LLE.  Patient taking short hopping steps on RLE, with heavy use of UE's on RW.  Initially attempted with crutches and then gave him RW as he was not able to manage crutches safely. Fatigued quickly.   Stairs            Wheelchair Mobility    Modified Rankin (Stroke Patients Only)  Balance Overall balance assessment: Needs assistance Sitting-balance support: No upper extremity supported;Feet supported Sitting balance-Leahy Scale: Fair     Standing balance support: Bilateral upper extremity supported Standing balance-Leahy Scale: Poor                              Cognition Arousal/Alertness: Awake/alert Behavior During Therapy: WFL for tasks assessed/performed Overall Cognitive Status: Within Functional Limits for tasks  assessed                                        Exercises      General Comments        Pertinent Vitals/Pain Pain Assessment: 0-10 Pain Score: 5  Pain Location: left ankle Pain Descriptors / Indicators: Sore Pain Intervention(s): Limited activity within patient's tolerance;Monitored during session;Repositioned    Home Living Family/patient expects to be discharged to:: Private residence Living Arrangements: Spouse/significant other Available Help at Discharge: Family;Available 24 hours/day(24 hours/day initially) Type of Home: Apartment Home Access: Level entry   Home Layout: One level Home Equipment: Cane - single point;Shower seat      Prior Function Level of Independence: Independent      Comments: Retired.  Now works as Teacher, early years/pre.   PT Goals (current goals can now be found in the care plan section) Acute Rehab PT Goals Patient Stated Goal: To return home today PT Goal Formulation: With patient Time For Goal Achievement: 09/14/17 Potential to Achieve Goals: Fair Progress towards PT goals: Progressing toward goals    Frequency    Min 4X/week      PT Plan Current plan remains appropriate    Co-evaluation              AM-PAC PT "6 Clicks" Daily Activity  Outcome Measure  Difficulty turning over in bed (including adjusting bedclothes, sheets and blankets)?: A Little Difficulty moving from lying on back to sitting on the side of the bed? : Unable Difficulty sitting down on and standing up from a chair with arms (e.g., wheelchair, bedside commode, etc,.)?: Unable Help needed moving to and from a bed to chair (including a wheelchair)?: A Little Help needed walking in hospital room?: A Little Help needed climbing 3-5 steps with a railing? : A Lot 6 Click Score: 13    End of Session Equipment Utilized During Treatment: Gait belt Activity Tolerance: Patient limited by fatigue;Patient limited by pain Patient left: in bed;with  call bell/phone within reach;with nursing/sitter in room;with family/visitor present Nurse Communication: Mobility status PT Visit Diagnosis: Other abnormalities of gait and mobility (R26.89);Pain Pain - Right/Left: Left Pain - part of body: Ankle and joints of foot;Leg     Time: 2505-3976 PT Time Calculation (min) (ACUTE ONLY): 28 min  Charges:  $Gait Training: 23-37 mins                    G Codes:       Rolinda Roan, PT, DPT Acute Rehabilitation Services Pager: [phone]    Thelma Comp 09/08/2017, 12:59 PM

## 2017-09-12 ENCOUNTER — Encounter: Payer: Self-pay | Admitting: Physician Assistant

## 2017-09-15 ENCOUNTER — Encounter: Payer: Self-pay | Admitting: Physician Assistant

## 2017-09-15 ENCOUNTER — Ambulatory Visit (INDEPENDENT_AMBULATORY_CARE_PROVIDER_SITE_OTHER): Payer: Medicare Other

## 2017-09-15 ENCOUNTER — Ambulatory Visit (INDEPENDENT_AMBULATORY_CARE_PROVIDER_SITE_OTHER): Payer: Worker's Compensation | Admitting: Orthopaedic Surgery

## 2017-09-15 ENCOUNTER — Ambulatory Visit (INDEPENDENT_AMBULATORY_CARE_PROVIDER_SITE_OTHER): Payer: Medicare Other | Admitting: Physician Assistant

## 2017-09-15 ENCOUNTER — Encounter (INDEPENDENT_AMBULATORY_CARE_PROVIDER_SITE_OTHER): Payer: Self-pay | Admitting: Orthopaedic Surgery

## 2017-09-15 ENCOUNTER — Other Ambulatory Visit: Payer: Self-pay | Admitting: *Deleted

## 2017-09-15 VITALS — BP 118/60 | HR 82 | Ht 73.0 in | Wt 218.0 lb

## 2017-09-15 DIAGNOSIS — Z0389 Encounter for observation for other suspected diseases and conditions ruled out: Secondary | ICD-10-CM | POA: Diagnosis not present

## 2017-09-15 DIAGNOSIS — S8265XD Nondisplaced fracture of lateral malleolus of left fibula, subsequent encounter for closed fracture with routine healing: Secondary | ICD-10-CM

## 2017-09-15 DIAGNOSIS — R001 Bradycardia, unspecified: Secondary | ICD-10-CM

## 2017-09-15 DIAGNOSIS — IMO0001 Reserved for inherently not codable concepts without codable children: Secondary | ICD-10-CM

## 2017-09-15 DIAGNOSIS — I10 Essential (primary) hypertension: Secondary | ICD-10-CM

## 2017-09-15 MED ORDER — LISINOPRIL 40 MG PO TABS: 40.0000 mg | ORAL_TABLET | Freq: Every morning | ORAL | 3 refills | Status: DC

## 2017-09-15 NOTE — Progress Notes (Signed)
Office Visit Note   Patient: Dalton Martin           Date of Birth: 02-Jun-1946           MRN: [id number] Visit Date: 09/15/2017              Requested by: No referring provider defined for this encounter. PCP: Default, Provider, MD   Assessment & Plan: Visit Diagnoses:  1. Closed nondisplaced fracture of lateral malleolus of left fibula with routine healing, subsequent encounter     Plan:  impression is stable left nondisplaced lateral malleolus fracture.  At this point I will like him to advance to 25% partial weightbearing with a Cam walker and with walker with physical therapy.  Follow-up in 3 weeks with 3 view x-rays of the left ankle.  TED hose was also provided today.  Follow-Up Instructions: Return in about 3 weeks (around 10/06/2017).   Orders:  Orders Placed This Encounter  Procedures  . XR Ankle Complete Left   No orders of the defined types were placed in this encounter.     Procedures: No procedures performed   Clinical Data: No additional findings.   Subjective: Chief Complaint  Patient presents with  . Left Ankle - Pain    Patient is a hospital follow-up for a nondisplaced fibula fracture that he sustained a days ago.  He is doing well in regards to the left ankle.  He does have some swelling and some moderate pain for which he takes Tylenol.  Denies any numbness and tingling.  Pain does not radiate.    Review of Systems  Constitutional: Negative.   All other systems reviewed and are negative.    Objective: Vital Signs: There were no vitals taken for this visit.  Physical Exam  Constitutional: He is oriented to person, place, and time. He appears well-developed and well-nourished.  HENT:  Head: Normocephalic and atraumatic.  Eyes: Pupils are equal, round, and reactive to light.  Neck: Neck supple.  Pulmonary/Chest: Effort normal.  Abdominal: Soft.  Musculoskeletal: Normal range of motion.  Neurological: He is alert and oriented to  person, place, and time.  Skin: Skin is warm.  Psychiatric: He has a normal mood and affect. His behavior is normal. Judgment and thought content normal.  Nursing note and vitals reviewed.   Ortho Exam Left ankle exam shows moderate swelling.  No neurovascular compromise. Specialty Comments:  No specialty comments available.  Imaging: Xr Ankle Complete Left  Result Date: 09/15/2017 Stable nondisplaced lateral malleolus fracture    PMFS History: Patient Active Problem List   Diagnosis Date Noted  . Normal coronary arteries 09/07/2017  . Bradycardia 09/06/2017  . Hypotension 09/06/2017  . Fibula fracture 09/06/2017  . Lightheadedness 07/01/2012  . Hypertension, uncontrolled 07/01/2012   Past Medical History:  Diagnosis Date  . Allergic rhinitis   . Cancer ([place])   . History of kidney stones   . History of prostate cancer    s/p  radical prostatectomy 2011  . Hyperlipidemia   . Hypertension   . OSA on CPAP    MILD OSA PER STUDY 2013  . Penile lesion   . Wears hearing aid    BILATERAL    Family History  Problem Relation Age of Onset  . Liver disease Mother   . Cancer Father     Past Surgical History:  Procedure Laterality Date  . CARDIAC CATHETERIZATION  07-01-2012  dr Angelena Form   normal coronary arteries/   ef  60-65%  .  INGUINAL HERNIA REPAIR Bilateral as teen  . LEFT HEART CATHETERIZATION WITH CORONARY ANGIOGRAM N/A 07/01/2012   Procedure: LEFT HEART CATHETERIZATION WITH CORONARY ANGIOGRAM;  Surgeon: Wellington Hampshire, MD;  Location: [facility];  Service: Cardiovascular;  Laterality: N/A;  . LESION DESTRUCTION N/A 05/27/2014   Procedure: EXCISION OF PENIS LESIONS;  Surgeon: Claybon Jabs, MD;  Location: [facility];  Service: Urology;  Laterality: N/A;  . PROSTATE SURGERY    . ROBOT ASSISTED LAPAROSCOPIC RADICAL PROSTATECTOMY  11-15-2009   right nerve spare/  bilateral pelvic lymphadenectomy   Social History   Occupational History  . Not on  file  Tobacco Use  . Smoking status: Former Smoker    Packs/day: 1.00    Years: 4.00    Pack years: 4.00    Types: Cigarettes    Last attempt to quit: 05/20/1973    Years since quitting: 44.3  . Smokeless tobacco: Never Used  Substance and Sexual Activity  . Alcohol use: Yes    Alcohol/week: 2.0 oz    Types: 4 Standard drinks or equivalent per week    Comment: weekends  . Drug use: No  . Sexual activity: Not on file

## 2017-09-15 NOTE — Progress Notes (Signed)
Cardiology Office Note    Date:  09/15/2017   ID:  Dalton Martin, DOB [birth year], MRN [id number]  PCP:  Default, Provider, MD  Cardiologist: Dr. Bronson Ing saw in hospital but live in [place]. Dr. Fletcher Anon did cath in 2013  Chief Complaint  Patient presents with  . Follow-up    History of Present Illness:  Dalton Martin is a 71 y.o. male with history of hypertension, HLD and obesity and normal coronary arteries in 2013 who had a mechanical fall and broke his left fibula.  He had a vagal episode in the ER with pain and had hypotension and sinus bradycardia with rates in the 30s.  He had no recurrence while in the hospital.  Echocardiogram was normal LVEF 60-65% with mild LVH no significant valve disease.  No further cardiac workup recommended.  Patient comes in today for follow-up.  He denies any dizziness or presyncopal symptoms.  No chest pain, palpitations, dyspnea.  He is in a wheelchair and has a follow-up with orthopedist today.  Asking for referral to primary care.    Past Medical History:  Diagnosis Date  . Allergic rhinitis   . Cancer ([place])   . History of kidney stones   . History of prostate cancer    s/p  radical prostatectomy 2011  . Hyperlipidemia   . Hypertension   . OSA on CPAP    MILD OSA PER STUDY 2013  . Penile lesion   . Wears hearing aid    BILATERAL    Past Surgical History:  Procedure Laterality Date  . CARDIAC CATHETERIZATION  07-01-2012  dr Angelena Form   normal coronary arteries/   ef  60-65%  . INGUINAL HERNIA REPAIR Bilateral as teen  . LEFT HEART CATHETERIZATION WITH CORONARY ANGIOGRAM N/A 07/01/2012   Procedure: LEFT HEART CATHETERIZATION WITH CORONARY ANGIOGRAM;  Surgeon: Wellington Hampshire, MD;  Location: [facility] CATH [facility];  Service: Cardiovascular;  Laterality: N/A;  . LESION DESTRUCTION N/A 05/27/2014   Procedure: EXCISION OF PENIS LESIONS;  Surgeon: Claybon Jabs, MD;  Location: [facility];  Service: Urology;  Laterality: N/A;    . PROSTATE SURGERY    . ROBOT ASSISTED LAPAROSCOPIC RADICAL PROSTATECTOMY  11-15-2009   right nerve spare/  bilateral pelvic lymphadenectomy    Current Medications: Current Meds  Medication Sig  . aspirin EC 81 MG EC tablet Take 1 tablet (81 mg total) by mouth daily.  . cholecalciferol (VITAMIN D) 1000 UNITS tablet Take 1,000 Units by mouth daily. Reported on 12/19/2015  . Cyanocobalamin (VITAMIN B-12 PO) Take 1 tablet by mouth daily.  . fluticasone (FLONASE) 50 MCG/ACT nasal spray Place 1 spray into both nostrils daily.  Marland Kitchen lisinopril (PRINIVIL,ZESTRIL) 40 MG tablet TAKE 1 TABLET BY MOUTH EVERY MORNING  . Multiple Vitamins-Minerals (MENS MULTIVITAMIN PLUS PO) Take 1 tablet by mouth daily.  Marland Kitchen senna-docusate (SENOKOT-S) 8.6-50 MG tablet Take 1 tablet by mouth at bedtime as needed for mild constipation.  . triamcinolone cream (KENALOG) 0.1 % Apply 1 application topically 2 (two) times daily as needed (rash).      Allergies:   Patient has no known allergies.   Social History   Socioeconomic History  . Marital status: Married    Spouse name: None  . Number of children: None  . Years of education: None  . Highest education level: None  Social Needs  . Financial resource strain: None  . Food insecurity - worry: None  . Food insecurity - inability: None  .  Transportation needs - medical: None  . Transportation needs - non-medical: None  Occupational History  . None  Tobacco Use  . Smoking status: Former Smoker    Packs/day: 1.00    Years: 4.00    Pack years: 4.00    Types: Cigarettes    Last attempt to quit: 05/20/1973    Years since quitting: 44.3  . Smokeless tobacco: Never Used  Substance and Sexual Activity  . Alcohol use: Yes    Alcohol/week: 2.0 oz    Types: 4 Standard drinks or equivalent per week    Comment: weekends  . Drug use: No  . Sexual activity: None  Other Topics Concern  . None  Social History Narrative  . None     Family History:  The patient's  family history includes Cancer in his father; Liver disease in his mother.   ROS:   Please see the history of present illness.    Review of Systems  Constitution: Negative.  HENT: Negative.   Cardiovascular: Negative.   Respiratory: Negative.   Endocrine: Negative.   Hematologic/Lymphatic: Negative.   Musculoskeletal: Positive for joint pain and stiffness.  Gastrointestinal: Negative.   Genitourinary: Negative.   Neurological: Negative.    All other systems reviewed and are negative.   PHYSICAL EXAM:   VS:  BP 118/60   Pulse 82   Ht 6\' 1"  (1.854 m)   Wt 218 lb (98.9 kg)   SpO2 98%   BMI 28.76 kg/m   Physical Exam  GEN: Well nourished, well developed, in no acute distress  Neck: no JVD, carotid bruits, or masses Cardiac:RRR; no murmurs, rubs, or gallops  Respiratory:  clear to auscultation bilaterally, normal work of breathing GI: soft, nontender, nondistended, + BS Ext: without cyanosis, clubbing, or edema, Good distal pulses bilaterally Neuro:  Alert and Oriented x 3 Psych: euthymic mood, full affect  Wt Readings from Last 3 Encounters:  09/15/17 218 lb (98.9 kg)  09/08/17 218 lb 7.6 oz (99.1 kg)  09/14/16 227 lb (103 kg)      Studies/Labs Reviewed:   EKG:  EKG is not ordered today.   Recent Labs: 09/06/2017: Magnesium 2.0; TSH 0.378 09/07/2017: BUN 10; Creatinine, Ser 1.21; Hemoglobin 11.6; Platelets 153; Potassium 3.6; Sodium 138   Lipid Panel    Component Value Date/Time   CHOL 189 04/16/2016 0945   TRIG 157 (H) 04/16/2016 0945   HDL 41 04/16/2016 0945   CHOLHDL 4.6 04/16/2016 0945   VLDL 31 (H) 04/16/2016 0945   LDLCALC 117 04/16/2016 0945    Additional studies/ records that were reviewed today include:  2D echo 12/9/18Study Conclusions   - Left ventricle: The cavity size was normal. Wall thickness was   increased in a pattern of mild LVH. Systolic function was normal.   The estimated ejection fraction was in the range of 60% to 65%.   Wall motion  was normal; there were no regional wall motion   abnormalities. Left ventricular diastolic function parameters   were normal. - Aortic valve: Mildly thickened, mildly calcified leaflets. - Inferior vena cava: The vessel was dilated.       ASSESSMENT:    1. Bradycardia   2. Essential hypertension   3. Normal coronary arteries      PLAN:  In order of problems listed above:  Bradycardia in the emergency room in the setting of vasovagal reaction after fibula fracture and pain.  Will order 48-hour monitor to rule out any recurrent bradycardia.  Heart rate is  82 today.  Patient has no symptoms of dizziness or presyncope.  Pending results of Holter can follow-up as needed  Hypertension controlled with lisinopril.  We will give him a refill on lisinopril and refer him to primary care for further management.  History of normal coronary arteries in 2013    Medication Adjustments/Labs and Tests Ordered: Current medicines are reviewed at length with the patient today.  Concerns regarding medicines are outlined above.  Medication changes, Labs and Tests ordered today are listed in the Patient Instructions below. Patient Instructions  Medication Instructions:  1. Your physician recommends that you continue on your current medications as directed. Please refer to the Current Medication list given to you today.   Labwork: NONE ORDERED TODAY  Testing/Procedures: Your physician has recommended that you wear a 48 HOUR holter monitor. Holter monitors are medical devices that record the heart's electrical activity. Doctors most often use these monitors to diagnose arrhythmias. Arrhythmias are problems with the speed or rhythm of the heartbeat. The monitor is a small, portable device. You can wear one while you do your normal daily activities. This is usually used to diagnose what is causing palpitations/syncope (passing out).    Follow-Up: AS NEEDED FOLLOW UP PENDING MONITOR RESULTS   YOU  ARE BEING REFERRED TO [facility] ON [address]. [place], [place] YOU CAN CALL THEIR OFFICE TO MAKE AN APPT [phone]  Any Other Special Instructions Will Be Listed Below (If Applicable).     If you need a refill on your cardiac medications before your next appointment, please call your pharmacy.      Sumner Boast, PA-C  09/15/2017 8:26 AM    [facility] [address], [place], [place]  [address] Phone: [phone]; Fax: [phone]

## 2017-09-15 NOTE — Patient Instructions (Signed)
Medication Instructions:  1. Your physician recommends that you continue on your current medications as directed. Please refer to the Current Medication list given to you today.   Labwork: NONE ORDERED TODAY  Testing/Procedures: Your physician has recommended that you wear a 48 HOUR holter monitor. Holter monitors are medical devices that record the heart's electrical activity. Doctors most often use these monitors to diagnose arrhythmias. Arrhythmias are problems with the speed or rhythm of the heartbeat. The monitor is a small, portable device. You can wear one while you do your normal daily activities. This is usually used to diagnose what is causing palpitations/syncope (passing out).    Follow-Up: AS NEEDED FOLLOW UP PENDING MONITOR RESULTS   YOU ARE BEING REFERRED TO [facility] ON [address]. [place], [place] YOU CAN CALL THEIR OFFICE TO MAKE AN APPT [phone]  Any Other Special Instructions Will Be Listed Below (If Applicable).     If you need a refill on your cardiac medications before your next appointment, please call your pharmacy.

## 2017-09-15 NOTE — Progress Notes (Signed)
HOLTER

## 2017-09-25 ENCOUNTER — Telehealth: Payer: Self-pay

## 2017-09-25 NOTE — Telephone Encounter (Signed)
spoke with patient about recent monitor results. patient verbalized understanding. patient thanked me for my call.

## 2017-10-06 ENCOUNTER — Telehealth (INDEPENDENT_AMBULATORY_CARE_PROVIDER_SITE_OTHER): Payer: Self-pay | Admitting: Orthopaedic Surgery

## 2017-10-06 ENCOUNTER — Ambulatory Visit (INDEPENDENT_AMBULATORY_CARE_PROVIDER_SITE_OTHER): Payer: Worker's Compensation | Admitting: Orthopaedic Surgery

## 2017-10-06 ENCOUNTER — Encounter (INDEPENDENT_AMBULATORY_CARE_PROVIDER_SITE_OTHER): Payer: Self-pay | Admitting: Orthopaedic Surgery

## 2017-10-06 ENCOUNTER — Ambulatory Visit (INDEPENDENT_AMBULATORY_CARE_PROVIDER_SITE_OTHER): Payer: Worker's Compensation

## 2017-10-06 DIAGNOSIS — M25572 Pain in left ankle and joints of left foot: Secondary | ICD-10-CM | POA: Diagnosis not present

## 2017-10-06 DIAGNOSIS — S8265XD Nondisplaced fracture of lateral malleolus of left fibula, subsequent encounter for closed fracture with routine healing: Secondary | ICD-10-CM

## 2017-10-06 MED ORDER — VITAMIN D (ERGOCALCIFEROL) 1.25 MG (50000 UNIT) PO CAPS: 50000.0000 [IU] | ORAL_CAPSULE | ORAL | 0 refills | Status: AC

## 2017-10-06 NOTE — Progress Notes (Signed)
Office Visit Note   Patient: Dalton Martin           Date of Birth: [birth year]           MRN: [id number] Visit Date: 10/06/2017              Requested by: No referring provider defined for this encounter. PCP: Default, Provider, MD   Assessment & Plan: Visit Diagnoses:  1. Closed nondisplaced fracture of lateral malleolus of left fibula with routine healing, subsequent encounter   2. Pain in left ankle and joints of left foot     Plan: At this point we are going to let [place] start weightbearing as tolerated in his cam walker.  He will continue to elevate and use use compression stockings for swelling.  I have also sent in a prescription for vitamin D3 50,000 units for which she will take 1 pill once weekly for 6 weeks.  He will follow-up with [place] in 4 weeks time for repeat evaluation and three-view x-rays of his ankle.  Follow-Up Instructions: Return in about 4 weeks (around 11/03/2017).   Orders:  Orders Placed This Encounter  Procedures  . XR Ankle Complete Left   Meds ordered this encounter  Medications  . Vitamin D, Ergocalciferol, (DRISDOL) 50000 units CAPS capsule    Sig: Take 1 capsule (50,000 Units total) by mouth every 7 (seven) days.    Dispense:  6 capsule    Refill:  0    Order Specific Question:   Supervising Provider    Answer:   Leandrew Koyanagi [[id number]]      Procedures: No procedures performed   Clinical Data: No additional findings.   Subjective: Chief Complaint  Patient presents with  . Left Ankle - Pain    HPI this is a pleasant 72 year old gentleman who presents our clinic today for follow-up of his left ankle.  He is about 4 weeks status post nondisplaced lateral malleolus fracture date of injury 09/05/2017.  He has been in a Cam Walker nonweightbearing over the past few weeks.  He admits to minimal pain but moderate swelling.  He has been elevating as much as possible as well as wearing compression stockings.  Review of Systems as detailed in  HPI all others reviewed and are negative.   Objective: Vital Signs: There were no vitals taken for this visit.  Physical Exam well-developed well-nourished gentleman in no acute distress.  Alert and oriented x3.  Ortho Exam examination of his left ankle reveals minimal tenderness at the distal fibula.  Limited range of motion throughout the ankle.  He is neurovascular intact distally.  Specialty Comments:  No specialty comments available.  Imaging: Xr Ankle Complete Left  Result Date: 10/06/2017 3 views of the left ankle reveal a healing nondisplaced fracture of the distal fibula with callus formation proximal aspect.    PMFS History: Patient Active Problem List   Diagnosis Date Noted  . Normal coronary arteries 09/07/2017  . Bradycardia 09/06/2017  . Hypotension 09/06/2017  . Fibula fracture 09/06/2017  . Lightheadedness 07/01/2012  . Hypertension, uncontrolled 07/01/2012   Past Medical History:  Diagnosis Date  . Allergic rhinitis   . Cancer ([place])   . History of kidney stones   . History of prostate cancer    s/p  radical prostatectomy 2011  . Hyperlipidemia   . Hypertension   . OSA on CPAP    MILD OSA PER STUDY 2013  . Penile lesion   . Wears hearing aid  BILATERAL    Family History  Problem Relation Age of Onset  . Liver disease Mother   . Cancer Father     Past Surgical History:  Procedure Laterality Date  . CARDIAC CATHETERIZATION  07-01-2012  dr Angelena Form   normal coronary arteries/   ef  60-65%  . INGUINAL HERNIA REPAIR Bilateral as teen  . LEFT HEART CATHETERIZATION WITH CORONARY ANGIOGRAM N/A 07/01/2012   Procedure: LEFT HEART CATHETERIZATION WITH CORONARY ANGIOGRAM;  Surgeon: Wellington Hampshire, MD;  Location: [facility];  Service: Cardiovascular;  Laterality: N/A;  . LESION DESTRUCTION N/A 05/27/2014   Procedure: EXCISION OF PENIS LESIONS;  Surgeon: Claybon Jabs, MD;  Location: [facility];  Service: Urology;  Laterality: N/A;  .  PROSTATE SURGERY    . ROBOT ASSISTED LAPAROSCOPIC RADICAL PROSTATECTOMY  11-15-2009   right nerve spare/  bilateral pelvic lymphadenectomy   Social History   Occupational History  . Not on file  Tobacco Use  . Smoking status: Former Smoker    Packs/day: 1.00    Years: 4.00    Pack years: 4.00    Types: Cigarettes    Last attempt to quit: 05/20/1973    Years since quitting: 44.4  . Smokeless tobacco: Never Used  Substance and Sexual Activity  . Alcohol use: Yes    Alcohol/week: 2.0 oz    Types: 4 Standard drinks or equivalent per week    Comment: weekends  . Drug use: No  . Sexual activity: Not on file

## 2017-10-06 NOTE — Telephone Encounter (Signed)
Alen Blew is working on a [employer] claim for patient and would like his medical records faxed to her. She left a voicemail. Her fax# [phone]

## 2017-10-07 NOTE — Telephone Encounter (Signed)
Faxed the 10/06/17 and 09/15/17 office notes

## 2017-10-07 NOTE — Telephone Encounter (Signed)
Please see message below

## 2017-10-08 ENCOUNTER — Telehealth (INDEPENDENT_AMBULATORY_CARE_PROVIDER_SITE_OTHER): Payer: Self-pay

## 2017-10-08 NOTE — Telephone Encounter (Signed)
Faxed the jan and dec office notes to the case mgr per her request [phone]

## 2017-11-03 ENCOUNTER — Ambulatory Visit (INDEPENDENT_AMBULATORY_CARE_PROVIDER_SITE_OTHER): Payer: Worker's Compensation

## 2017-11-03 ENCOUNTER — Ambulatory Visit (INDEPENDENT_AMBULATORY_CARE_PROVIDER_SITE_OTHER): Payer: Worker's Compensation | Admitting: Orthopaedic Surgery

## 2017-11-03 DIAGNOSIS — S8265XD Nondisplaced fracture of lateral malleolus of left fibula, subsequent encounter for closed fracture with routine healing: Secondary | ICD-10-CM | POA: Diagnosis not present

## 2017-11-03 DIAGNOSIS — G8929 Other chronic pain: Secondary | ICD-10-CM | POA: Diagnosis not present

## 2017-11-03 DIAGNOSIS — M25562 Pain in left knee: Secondary | ICD-10-CM | POA: Diagnosis not present

## 2017-11-03 DIAGNOSIS — S8265XA Nondisplaced fracture of lateral malleolus of left fibula, initial encounter for closed fracture: Secondary | ICD-10-CM | POA: Insufficient documentation

## 2017-11-03 NOTE — Progress Notes (Signed)
Patient: Dalton Martin           Date of Birth: 10/09/1945           MRN: [id number] Visit Date: 11/03/2017 PCP: Default, Provider, MD   Assessment & Plan:  Chief Complaint:  Chief Complaint  Patient presents with  . Left Knee - Pain  . Left Ankle - Pain   Visit Diagnoses:  1. Closed nondisplaced fracture of lateral malleolus of left fibula with routine healing, subsequent encounter   2. Chronic pain of left knee     Plan: Dalton Martin comes in for follow-up.  Nearly 7 weeks out left distal fibula fracture.  He has been weightbearing as tolerated in a Cam walker.  Doing okay with his however he does admit to some pain to the proximal fibula.  Examination of the left ankle reveals minimal tenderness over the fracture site.  Limited range of motion throughout the ankle.  He is nervous intact distally.  This point, we are going to have Dalton Martin transition out of his Cam walker into an ASO.  We are also going to start him in outpatient physical therapy.  A prescription was given for this.  This is a workers comp injury and he drives a school bus for work, so we will keep him out of work for the next 4 weeks.  He will follow-up with [place] in 4 weeks time for repeat evaluation and x-ray.  Follow-Up Instructions: Return in about 4 weeks (around 12/01/2017).   Orders:  Orders Placed This Encounter  Procedures  . XR Ankle Complete Left  . XR KNEE 3 VIEW LEFT   No orders of the defined types were placed in this encounter.   Imaging: Xr Ankle Complete Left  Result Date: 11/03/2017 X-rays of the left ankle reveal stable alignment of the fracture with good callus formation.  Xr Knee 3 View Left  Result Date: 11/03/2017 X-rays of the left knee reveal moderate tricompartmental degenerative changes.   PMFS History: Patient Active Problem List   Diagnosis Date Noted  . Closed nondisplaced fracture of lateral malleolus of left fibula 11/03/2017  . Normal coronary arteries 09/07/2017  . Bradycardia  09/06/2017  . Hypotension 09/06/2017  . Fibula fracture 09/06/2017  . Lightheadedness 07/01/2012  . Hypertension, uncontrolled 07/01/2012   Past Medical History:  Diagnosis Date  . Allergic rhinitis   . Cancer ([place])   . History of kidney stones   . History of prostate cancer    s/p  radical prostatectomy 2011  . Hyperlipidemia   . Hypertension   . OSA on CPAP    MILD OSA PER STUDY 2013  . Penile lesion   . Wears hearing aid    BILATERAL    Family History  Problem Relation Age of Onset  . Liver disease Mother   . Cancer Father     Past Surgical History:  Procedure Laterality Date  . CARDIAC CATHETERIZATION  07-01-2012  dr Angelena Form   normal coronary arteries/   ef  60-65%  . INGUINAL HERNIA REPAIR Bilateral as teen  . LEFT HEART CATHETERIZATION WITH CORONARY ANGIOGRAM N/A 07/01/2012   Procedure: LEFT HEART CATHETERIZATION WITH CORONARY ANGIOGRAM;  Surgeon: Wellington Hampshire, MD;  Location: [facility];  Service: Cardiovascular;  Laterality: N/A;  . LESION DESTRUCTION N/A 05/27/2014   Procedure: EXCISION OF PENIS LESIONS;  Surgeon: Claybon Jabs, MD;  Location: [facility];  Service: Urology;  Laterality: N/A;  . PROSTATE SURGERY    .  ROBOT ASSISTED LAPAROSCOPIC RADICAL PROSTATECTOMY  11-15-2009   right nerve spare/  bilateral pelvic lymphadenectomy   Social History   Occupational History  . Not on file  Tobacco Use  . Smoking status: Former Smoker    Packs/day: 1.00    Years: 4.00    Pack years: 4.00    Types: Cigarettes    Last attempt to quit: 05/20/1973    Years since quitting: 44.4  . Smokeless tobacco: Never Used  Substance and Sexual Activity  . Alcohol use: Yes    Alcohol/week: 2.0 oz    Types: 4 Standard drinks or equivalent per week    Comment: weekends  . Drug use: No  . Sexual activity: Not on file

## 2017-11-07 ENCOUNTER — Ambulatory Visit (INDEPENDENT_AMBULATORY_CARE_PROVIDER_SITE_OTHER): Payer: Medicare Other | Admitting: Internal Medicine

## 2017-11-07 ENCOUNTER — Telehealth: Payer: Self-pay

## 2017-11-07 ENCOUNTER — Encounter: Payer: Self-pay | Admitting: Internal Medicine

## 2017-11-07 ENCOUNTER — Other Ambulatory Visit (INDEPENDENT_AMBULATORY_CARE_PROVIDER_SITE_OTHER): Payer: Medicare Other

## 2017-11-07 ENCOUNTER — Other Ambulatory Visit: Payer: Self-pay | Admitting: Internal Medicine

## 2017-11-07 VITALS — BP 136/86 | HR 82 | Temp 97.9°F | Ht 73.0 in | Wt 217.0 lb

## 2017-11-07 DIAGNOSIS — R739 Hyperglycemia, unspecified: Secondary | ICD-10-CM

## 2017-11-07 DIAGNOSIS — Z9989 Dependence on other enabling machines and devices: Secondary | ICD-10-CM

## 2017-11-07 DIAGNOSIS — Z1159 Encounter for screening for other viral diseases: Secondary | ICD-10-CM

## 2017-11-07 DIAGNOSIS — C61 Malignant neoplasm of prostate: Secondary | ICD-10-CM

## 2017-11-07 DIAGNOSIS — Z0000 Encounter for general adult medical examination without abnormal findings: Secondary | ICD-10-CM

## 2017-11-07 DIAGNOSIS — Z23 Encounter for immunization: Secondary | ICD-10-CM | POA: Diagnosis not present

## 2017-11-07 DIAGNOSIS — E785 Hyperlipidemia, unspecified: Secondary | ICD-10-CM | POA: Insufficient documentation

## 2017-11-07 DIAGNOSIS — J309 Allergic rhinitis, unspecified: Secondary | ICD-10-CM | POA: Insufficient documentation

## 2017-11-07 DIAGNOSIS — D649 Anemia, unspecified: Secondary | ICD-10-CM | POA: Insufficient documentation

## 2017-11-07 DIAGNOSIS — Z87442 Personal history of urinary calculi: Secondary | ICD-10-CM | POA: Insufficient documentation

## 2017-11-07 DIAGNOSIS — G4733 Obstructive sleep apnea (adult) (pediatric): Secondary | ICD-10-CM | POA: Insufficient documentation

## 2017-11-07 HISTORY — DX: Malignant neoplasm of prostate: C61

## 2017-11-07 LAB — HEPATIC FUNCTION PANEL
ALBUMIN: 4.5 g/dL (ref 3.5–5.2)
ALK PHOS: 54 U/L (ref 39–117)
ALT: 14 U/L (ref 0–53)
AST: 16 U/L (ref 0–37)
BILIRUBIN DIRECT: 0.2 mg/dL (ref 0.0–0.3)
TOTAL PROTEIN: 7.4 g/dL (ref 6.0–8.3)
Total Bilirubin: 1.1 mg/dL (ref 0.2–1.2)

## 2017-11-07 LAB — CBC WITH DIFFERENTIAL/PLATELET
BASOS ABS: 29 {cells}/uL (ref 0–200)
Basophils Relative: 0.5 %
EOS ABS: 157 {cells}/uL (ref 15–500)
EOS PCT: 2.7 %
HCT: 44.7 % (ref 38.5–50.0)
HEMOGLOBIN: 15.2 g/dL (ref 13.2–17.1)
Lymphs Abs: 2361 cells/uL (ref 850–3900)
MCH: 29.4 pg (ref 27.0–33.0)
MCHC: 34 g/dL (ref 32.0–36.0)
MCV: 86.5 fL (ref 80.0–100.0)
MONOS PCT: 8.6 %
MPV: 12.3 fL (ref 7.5–12.5)
NEUTROS ABS: 2755 {cells}/uL (ref 1500–7800)
Neutrophils Relative %: 47.5 %
Platelets: 195 10*3/uL (ref 140–400)
RBC: 5.17 10*6/uL (ref 4.20–5.80)
RDW: 14.4 % (ref 11.0–15.0)
Total Lymphocyte: 40.7 %
WBC mixed population: 499 cells/uL (ref 200–950)
WBC: 5.8 10*3/uL (ref 3.8–10.8)

## 2017-11-07 LAB — TSH: TSH: 0.59 u[IU]/mL (ref 0.35–4.50)

## 2017-11-07 LAB — LIPID PANEL
CHOL/HDL RATIO: 5
Cholesterol: 210 mg/dL — ABNORMAL HIGH (ref 0–200)
HDL: 42 mg/dL (ref >39.00)
LDL CALC: 136 mg/dL — AB (ref 0–99)
NonHDL: 168.46
TRIGLYCERIDES: 163 mg/dL — AB (ref 0.0–149.0)
VLDL: 32.6 mg/dL (ref 0.0–40.0)

## 2017-11-07 LAB — BASIC METABOLIC PANEL
BUN: 17 mg/dL (ref 6–23)
CO2: 30 mEq/L (ref 19–32)
CREATININE: 1.13 mg/dL (ref 0.40–1.50)
Calcium: 9.8 mg/dL (ref 8.4–10.5)
Chloride: 100 mEq/L (ref 96–112)
GFR: 82.55 mL/min (ref >60.00)
Glucose, Bld: 112 mg/dL — ABNORMAL HIGH (ref 70–99)
Potassium: 4.9 mEq/L (ref 3.5–5.1)
Sodium: 137 mEq/L (ref 135–145)

## 2017-11-07 LAB — HEMOGLOBIN A1C: HEMOGLOBIN A1C: 6 % (ref 4.6–6.5)

## 2017-11-07 LAB — URINALYSIS, ROUTINE W REFLEX MICROSCOPIC
BILIRUBIN URINE: NEGATIVE
Ketones, ur: NEGATIVE
Leukocytes, UA: NEGATIVE
Nitrite: NEGATIVE
PH: 6 (ref 5.0–8.0)
Specific Gravity, Urine: 1.015 (ref 1.000–1.030)
TOTAL PROTEIN, URINE-UPE24: NEGATIVE
URINE GLUCOSE: NEGATIVE
UROBILINOGEN UA: 0.2 (ref 0.0–1.0)
WBC UA: NONE SEEN — AB (ref 0–?)

## 2017-11-07 LAB — PSA: PSA: 0 ng/mL — ABNORMAL LOW (ref 0.10–4.00)

## 2017-11-07 MED ORDER — ROSUVASTATIN CALCIUM 10 MG PO TABS
10.0000 mg | ORAL_TABLET | Freq: Every day | ORAL | 3 refills | Status: DC
Start: 2017-11-07 — End: 2018-10-02
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
## 2017-11-07 NOTE — Assessment & Plan Note (Signed)

## 2017-11-07 NOTE — Telephone Encounter (Signed)
Called pt, LVM.   CRM created.  

## 2017-11-07 NOTE — Patient Instructions (Addendum)
You had the Prevnar 13 pneumonia shot today  Please let us know if you are due for colonoscopy (if more than 10 yrs since the last)  Please continue all other medications as before, and refills have been done if requested.  Please have the pharmacy call with any other refills you may need.  Please continue your efforts at being more active, low cholesterol diet, and weight control.  You are otherwise up to date with prevention measures today.  Please keep your appointments with your specialists as you may have planned  Please go to the LAB in the Basement (turn left off the elevator) for the tests to be done today  You will be contacted by phone if any changes need to be made immediately.  Otherwise, you will receive a letter about your results with an explanation, but please check with MyChart first.  Please remember to sign up for MyChart if you have not done so, as this will be important to you in the future with finding out test results, communicating by private email, and scheduling acute appointments online when needed.  Please return in 1 year for your yearly visit, or sooner if needed, with Lab testing done 3-5 days before

## 2017-11-07 NOTE — Assessment & Plan Note (Signed)
Lab Results  Component Value Date   HGBA1C 6.0 11/07/2017  stable overall by history and exam, recent data reviewed with pt, and pt to continue medical treatment as before,  to f/u any worsening symptoms or concerns

## 2017-11-07 NOTE — Assessment & Plan Note (Signed)
For f/u psa, declines urology for now

## 2017-11-07 NOTE — Telephone Encounter (Signed)
-----   Message from Biagio Borg, MD sent at 11/07/2017  2:40 PM EST ----- Left message on MyChart, pt to cont same tx except  The test results show that your current treatment is OK, except the LDL cholesterol is mild to moderately elevated.  Please start Crestor 10 mg per day to reduce this and your future heart and stroke risk.  I will send a prescription and you should be called from the office.    Dalton Martin to please inform pt, I will do rx

## 2017-11-08 LAB — HEPATITIS C ANTIBODY
Hepatitis C Ab: NONREACTIVE
SIGNAL TO CUT-OFF: 0.01 (ref <1.00)

## 2017-11-20 ENCOUNTER — Telehealth (INDEPENDENT_AMBULATORY_CARE_PROVIDER_SITE_OTHER): Payer: Self-pay

## 2017-11-20 NOTE — Telephone Encounter (Signed)
Faxed the 11/03/17 office note to case mgr per her request [phone]

## 2017-11-20 NOTE — Telephone Encounter (Signed)
Received notification from [facility] that pt is scheduled for PT @ [facility] in [place] starting 11/24/17 and has been approved for 12 visits

## 2017-12-01 ENCOUNTER — Ambulatory Visit (INDEPENDENT_AMBULATORY_CARE_PROVIDER_SITE_OTHER): Payer: Worker's Compensation | Admitting: Orthopaedic Surgery

## 2017-12-01 ENCOUNTER — Ambulatory Visit (INDEPENDENT_AMBULATORY_CARE_PROVIDER_SITE_OTHER): Payer: Worker's Compensation

## 2017-12-01 ENCOUNTER — Encounter (INDEPENDENT_AMBULATORY_CARE_PROVIDER_SITE_OTHER): Payer: Self-pay | Admitting: Orthopaedic Surgery

## 2017-12-01 DIAGNOSIS — M1712 Unilateral primary osteoarthritis, left knee: Secondary | ICD-10-CM

## 2017-12-01 DIAGNOSIS — M25572 Pain in left ankle and joints of left foot: Secondary | ICD-10-CM

## 2017-12-01 DIAGNOSIS — S8265XD Nondisplaced fracture of lateral malleolus of left fibula, subsequent encounter for closed fracture with routine healing: Secondary | ICD-10-CM

## 2017-12-01 MED ORDER — LIDOCAINE HCL 1 % IJ SOLN
2.0000 mL | INTRAMUSCULAR | Status: AC | PRN
  Administered 2017-12-01: 2 mL

## 2017-12-01 MED ORDER — METHYLPREDNISOLONE ACETATE 40 MG/ML IJ SUSP
40.0000 mg | INTRAMUSCULAR | Status: AC | PRN
  Administered 2017-12-01: 40 mg via INTRA_ARTICULAR

## 2017-12-01 MED ORDER — BUPIVACAINE HCL 0.5 % IJ SOLN
2.0000 mL | INTRAMUSCULAR | Status: AC | PRN
  Administered 2017-12-01: 2 mL via INTRA_ARTICULAR

## 2017-12-01 NOTE — Progress Notes (Signed)
Office Visit Note   Patient: Dalton Martin           Date of Birth: 02/19/1946           MRN: [id number] Visit Date: 12/01/2017              Requested by: No referring provider defined for this encounter. PCP: Biagio Borg, MD   Assessment & Plan: Visit Diagnoses:  1. Pain in left ankle and joints of left foot   2. Primary osteoarthritis of left knee   3. Closed nondisplaced fracture of lateral malleolus of left fibula with routine healing, subsequent encounter     Plan: Impression is healed left distal fibula fracture and left knee osteoarthritis exacerbation.  Sounds like his left knee is really exacerbated by his recent ankle injury.  According to the physical therapy notes his knee is causing him more problems than the ankle.  We did perform a cortisone injection today which will hopefully give him significant relief and allow facilitation of physical therapy.  Continue desk duty.  Recheck in 6 weeks.   Follow-Up Instructions: Return in about 6 weeks (around 01/12/2018).   Orders:  Orders Placed This Encounter  Procedures  . XR KNEE 3 VIEW LEFT  . XR Ankle Complete Left   No orders of the defined types were placed in this encounter.     Procedures: Large Joint Inj: L knee on 12/01/2017 1:49 PM Details: 22 G needle Medications: 2 mL bupivacaine 0.5 %; 2 mL lidocaine 1 %; 40 mg methylPREDNISolone acetate 40 MG/ML Outcome: tolerated well, no immediate complications Patient was prepped and draped in the usual sterile fashion.       Clinical Data: No additional findings.   Subjective: Chief Complaint  Patient presents with  . Left Knee - Pain, Follow-up  . Left Ankle - Follow-up, Pain    Patient follows up today for his left fibula fracture and knee pain.  He has had previous sessions with physical therapy and the main problem is actually his left knee and not his ankle.  This is severely hindering his progress.    Review of Systems  Constitutional:  Negative.   All other systems reviewed and are negative.    Objective: Vital Signs: There were no vitals taken for this visit.  Physical Exam  Constitutional: He is oriented to person, place, and time. He appears well-developed and well-nourished.  Pulmonary/Chest: Effort normal.  Abdominal: Soft.  Neurological: He is alert and oriented to person, place, and time.  Skin: Skin is warm.  Psychiatric: He has a normal mood and affect. His behavior is normal. Judgment and thought content normal.  Nursing note and vitals reviewed.   Ortho Exam Left knee exam shows a trace effusion.  Normal range of motion.  Collaterals and cruciates are stable.  His left knee pain is mainly lateral which radiates down the anterolateral aspect of the shin.  Left ankle exam shows some mild swelling.  There is no tenderness to palpation. Specialty Comments:  No specialty comments available.  Imaging: Xr Ankle Complete Left  Result Date: 12/01/2017 Healed lateral malleolus fracture.  Mild degenerative arthritis  Xr Knee 3 View Left  Result Date: 12/01/2017 Advanced tricompartmental degenerative joint disease    PMFS History: Patient Active Problem List   Diagnosis Date Noted  . Preventative health care 11/07/2017  . Anemia 11/07/2017  . Hyperglycemia 11/07/2017  . Prostate cancer ([place]) 11/07/2017  . Hyperlipidemia   . Allergic rhinitis   .  History of kidney stones   . OSA on CPAP   . Closed nondisplaced fracture of lateral malleolus of left fibula 11/03/2017  . Normal coronary arteries 09/07/2017  . Bradycardia 09/06/2017  . Hypotension 09/06/2017  . Fibula fracture 09/06/2017  . Lightheadedness 07/01/2012  . Hypertension, uncontrolled 07/01/2012   Past Medical History:  Diagnosis Date  . Allergic rhinitis   . Cancer ([place])   . History of kidney stones   . History of prostate cancer    s/p  radical prostatectomy 2011  . Hyperlipidemia   . Hypertension   . OSA on CPAP    MILD OSA  PER STUDY 2013  . Penile lesion   . Prostate cancer ([place]) 11/07/2017  . Wears hearing aid    BILATERAL    Family History  Problem Relation Age of Onset  . Liver disease Mother   . Cancer Father     Past Surgical History:  Procedure Laterality Date  . CARDIAC CATHETERIZATION  07-01-2012  dr Angelena Form   normal coronary arteries/   ef  60-65%  . INGUINAL HERNIA REPAIR Bilateral as teen  . LEFT HEART CATHETERIZATION WITH CORONARY ANGIOGRAM N/A 07/01/2012   Procedure: LEFT HEART CATHETERIZATION WITH CORONARY ANGIOGRAM;  Surgeon: Wellington Hampshire, MD;  Location: [facility];  Service: Cardiovascular;  Laterality: N/A;  . LESION DESTRUCTION N/A 05/27/2014   Procedure: EXCISION OF PENIS LESIONS;  Surgeon: Claybon Jabs, MD;  Location: [facility];  Service: Urology;  Laterality: N/A;  . PROSTATE SURGERY    . ROBOT ASSISTED LAPAROSCOPIC RADICAL PROSTATECTOMY  11-15-2009   right nerve spare/  bilateral pelvic lymphadenectomy   Social History   Occupational History  . Not on file  Tobacco Use  . Smoking status: Former Smoker    Packs/day: 1.00    Years: 4.00    Pack years: 4.00    Types: Cigarettes    Last attempt to quit: 05/20/1973    Years since quitting: 44.5  . Smokeless tobacco: Never Used  Substance and Sexual Activity  . Alcohol use: Yes    Alcohol/week: 2.0 oz    Types: 4 Standard drinks or equivalent per week    Comment: weekends  . Drug use: No  . Sexual activity: Not on file

## 2017-12-15 ENCOUNTER — Other Ambulatory Visit: Payer: Self-pay | Admitting: Internal Medicine

## 2017-12-15 ENCOUNTER — Telehealth: Payer: Self-pay | Admitting: Internal Medicine

## 2017-12-15 NOTE — Telephone Encounter (Signed)
Pt called for lab results; pt given results per Dr Cathlean Cower, "The test results show that your current treatment is OK, except the LDL cholesterol is mild to moderately elevated. Please start Crestor 10 mg per day to reduce this and your future heart and stroke risk. I will send a prescription and you should be called from the office."; he verbalizes understanding and states that he has started taking the crestor;  pt also states that he needs a refill on amlodopine; pt instructed to to contact his pharmacy for his refill; not able to chart in result note because this information was previously reported on 11/07/17.

## 2017-12-18 ENCOUNTER — Telehealth (INDEPENDENT_AMBULATORY_CARE_PROVIDER_SITE_OTHER): Payer: Self-pay

## 2017-12-18 NOTE — Telephone Encounter (Signed)
Faxed the 12/01/17 office and work note to case mgr per her request

## 2018-01-12 ENCOUNTER — Other Ambulatory Visit (INDEPENDENT_AMBULATORY_CARE_PROVIDER_SITE_OTHER): Payer: Self-pay

## 2018-01-12 ENCOUNTER — Encounter (INDEPENDENT_AMBULATORY_CARE_PROVIDER_SITE_OTHER): Payer: Self-pay | Admitting: Orthopaedic Surgery

## 2018-01-12 ENCOUNTER — Ambulatory Visit (INDEPENDENT_AMBULATORY_CARE_PROVIDER_SITE_OTHER): Payer: Worker's Compensation | Admitting: Orthopaedic Surgery

## 2018-01-12 DIAGNOSIS — S8265XD Nondisplaced fracture of lateral malleolus of left fibula, subsequent encounter for closed fracture with routine healing: Secondary | ICD-10-CM

## 2018-01-12 DIAGNOSIS — M1712 Unilateral primary osteoarthritis, left knee: Secondary | ICD-10-CM | POA: Diagnosis not present

## 2018-01-12 MED ORDER — BUPIVACAINE HCL 0.5 % IJ SOLN
2.0000 mL | INTRAMUSCULAR | Status: AC | PRN
  Administered 2018-01-12: 2 mL via INTRA_ARTICULAR

## 2018-01-12 MED ORDER — LIDOCAINE HCL 1 % IJ SOLN
2.0000 mL | INTRAMUSCULAR | Status: AC | PRN
  Administered 2018-01-12: 2 mL

## 2018-01-12 MED ORDER — METHYLPREDNISOLONE ACETATE 40 MG/ML IJ SUSP
40.0000 mg | INTRAMUSCULAR | Status: AC | PRN
  Administered 2018-01-12: 40 mg via INTRA_ARTICULAR

## 2018-01-12 NOTE — Addendum Note (Signed)
Addended by: Precious Bard on: 01/12/2018 01:27 PM   Modules accepted: Orders

## 2018-01-12 NOTE — Progress Notes (Signed)
Office Visit Note   Patient: Dalton Martin           Date of Birth: Jun 14, 1946           MRN: [id number] Visit Date: 01/12/2018              Requested by: Biagio Borg, MD [address], [place] [address] PCP: Biagio Borg, MD   Assessment & Plan: Visit Diagnoses:  1. Primary osteoarthritis of left knee   2. Closed nondisplaced fracture of lateral malleolus of left fibula with routine healing, subsequent encounter     Plan: From an ankle standpoint I think he is doing fine and he can be as active as he wants.  He has no restrictions from a left ankle standpoint.  From a left knee standpoint his arthritis continues to be inflamed and he has an effusion.  We performed aspiration and cortisone injection today which he tolerated well.  We will see him back in 6 weeks for recheck.  May benefit him to avoid any prolonged standing or walking or physical activity.  Follow-Up Instructions: Return in about 6 weeks (around 02/23/2018).   Orders:  No orders of the defined types were placed in this encounter.  No orders of the defined types were placed in this encounter.     Procedures: Large Joint Inj: L knee on 01/12/2018 1:04 PM Details: 22 G needle Medications: 2 mL bupivacaine 0.5 %; 2 mL lidocaine 1 %; 40 mg methylPREDNISolone acetate 40 MG/ML Aspirate: 22 mL blood-tinged; sent for lab analysis Outcome: tolerated well, no immediate complications Patient was prepped and draped in the usual sterile fashion.       Clinical Data: No additional findings.   Subjective: Chief Complaint  Patient presents with  . Left Ankle - Pain, Follow-up  . Left Knee - Pain, Follow-up    Patient follows up today for his left knee and ankle pain.  His ankle is doing much better.  He still has a little bit of swelling around the lateral malleolus but he has no pain with palpation.  His main complaint is his knee which is still swollen.  The cortisone injection gave him partial  relief.  He still endorses swelling of the knee joint.  He has pain that refers to the back of the knee.   Review of Systems  Constitutional: Negative.   All other systems reviewed and are negative.    Objective: Vital Signs: There were no vitals taken for this visit.  Physical Exam  Constitutional: He is oriented to person, place, and time. He appears well-developed and well-nourished.  Pulmonary/Chest: Effort normal.  Abdominal: Soft.  Neurological: He is alert and oriented to person, place, and time.  Skin: Skin is warm.  Psychiatric: He has a normal mood and affect. His behavior is normal. Judgment and thought content normal.  Nursing note and vitals reviewed.   Ortho Exam Left ankle exam shows mild swelling over the lateral malleolus.  No neurovascular compromise. Left knee exam shows a joint effusion.  Collaterals and cruciates are stable.  No signs of infection. Specialty Comments:  No specialty comments available.  Imaging: No results found.   PMFS History: Patient Active Problem List   Diagnosis Date Noted  . Preventative health care 11/07/2017  . Anemia 11/07/2017  . Hyperglycemia 11/07/2017  . Prostate cancer ([place]) 11/07/2017  . Hyperlipidemia   . Allergic rhinitis   . History of kidney stones   . OSA  on CPAP   . Closed nondisplaced fracture of lateral malleolus of left fibula 11/03/2017  . Normal coronary arteries 09/07/2017  . Bradycardia 09/06/2017  . Hypotension 09/06/2017  . Fibula fracture 09/06/2017  . Lightheadedness 07/01/2012  . Hypertension, uncontrolled 07/01/2012   Past Medical History:  Diagnosis Date  . Allergic rhinitis   . Cancer ([place])   . History of kidney stones   . History of prostate cancer    s/p  radical prostatectomy 2011  . Hyperlipidemia   . Hypertension   . OSA on CPAP    MILD OSA PER STUDY 2013  . Penile lesion   . Prostate cancer ([place]) 11/07/2017  . Wears hearing aid    BILATERAL    Family History  Problem  Relation Age of Onset  . Liver disease Mother   . Cancer Father     Past Surgical History:  Procedure Laterality Date  . CARDIAC CATHETERIZATION  07-01-2012  dr Angelena Form   normal coronary arteries/   ef  60-65%  . INGUINAL HERNIA REPAIR Bilateral as teen  . LEFT HEART CATHETERIZATION WITH CORONARY ANGIOGRAM N/A 07/01/2012   Procedure: LEFT HEART CATHETERIZATION WITH CORONARY ANGIOGRAM;  Surgeon: Wellington Hampshire, MD;  Location: [facility] CATH [facility];  Service: Cardiovascular;  Laterality: N/A;  . LESION DESTRUCTION N/A 05/27/2014   Procedure: EXCISION OF PENIS LESIONS;  Surgeon: Claybon Jabs, MD;  Location: [facility];  Service: Urology;  Laterality: N/A;  . PROSTATE SURGERY    . ROBOT ASSISTED LAPAROSCOPIC RADICAL PROSTATECTOMY  11-15-2009   right nerve spare/  bilateral pelvic lymphadenectomy   Social History   Occupational History  . Not on file  Tobacco Use  . Smoking status: Former Smoker    Packs/day: 1.00    Years: 4.00    Pack years: 4.00    Types: Cigarettes    Last attempt to quit: 05/20/1973    Years since quitting: 44.6  . Smokeless tobacco: Never Used  Substance and Sexual Activity  . Alcohol use: Yes    Alcohol/week: 2.0 oz    Types: 4 Standard drinks or equivalent per week    Comment: weekends  . Drug use: No  . Sexual activity: Not on file

## 2018-01-13 LAB — SYNOVIAL CELL COUNT + DIFF, W/ CRYSTALS
Basophils, %: 0 %
Eosinophils-Synovial: 1 % (ref 0–2)
LYMPHOCYTES-SYNOVIAL FLD: 30 % (ref 0–74)
MONOCYTE/MACROPHAGE: 68 % (ref 0–69)
Neutrophil, Synovial: 1 % (ref 0–24)
SYNOVIOCYTES, %: 0 % (ref 0–15)
WBC, SYNOVIAL: 116 {cells}/uL (ref <150)

## 2018-01-13 LAB — TIQ-NTM

## 2018-02-24 ENCOUNTER — Ambulatory Visit (INDEPENDENT_AMBULATORY_CARE_PROVIDER_SITE_OTHER): Payer: Worker's Compensation | Admitting: Orthopaedic Surgery

## 2018-02-24 ENCOUNTER — Telehealth (INDEPENDENT_AMBULATORY_CARE_PROVIDER_SITE_OTHER): Payer: Self-pay | Admitting: Orthopaedic Surgery

## 2018-02-24 DIAGNOSIS — M1712 Unilateral primary osteoarthritis, left knee: Secondary | ICD-10-CM | POA: Diagnosis not present

## 2018-02-24 NOTE — Telephone Encounter (Addendum)
Pt request/ WC adjus Pt is needing verbal orders to continue physical therapy  Please send info to [facility] adjustor as well  Earley Abide Case manager  [phone]

## 2018-02-24 NOTE — Progress Notes (Signed)
Office Visit Note   Patient: Dalton Martin           Date of Birth: 1946/05/25           MRN: [id number] Visit Date: 02/24/2018              Requested by: Biagio Borg, MD [address], [place] [address] PCP: Biagio Borg, MD   Assessment & Plan: Visit Diagnoses:  1. Primary osteoarthritis of left knee     Plan: Impression is end-stage degenerative joint disease of his left knee.  At this point patient would like to try HA injections to see if this will give him better relief than the cortisone injections.  He does have fairly advanced arthritis which I have discussed total knee replacement and the associated risks and benefits.   we will bring him back for Visco supplementation injection once it has been approved. Total face to face encounter time was greater than 25 minutes and over half of this time was spent in counseling and/or coordination of care.  Follow-Up Instructions: Return if symptoms worsen or fail to improve.   Orders:  No orders of the defined types were placed in this encounter.  No orders of the defined types were placed in this encounter.     Procedures: No procedures performed   Clinical Data: No additional findings.   Subjective: Chief Complaint  Patient presents with  . Left Lower Leg - Pain, Follow-up    DOI left ankle fracture    Patient is following up for his left knee pain.  We previously performed aspiration and injection which did help for about a week.  He still has referred pain down the anterolateral aspect of the calf with ambulation.  Denies any swelling.   Review of Systems  Constitutional: Negative.   All other systems reviewed and are negative.    Objective: Vital Signs: There were no vitals taken for this visit.  Physical Exam  Constitutional: He is oriented to person, place, and time. He appears well-developed and well-nourished.  Pulmonary/Chest: Effort normal.  Abdominal: Soft.  Neurological: He is  alert and oriented to person, place, and time.  Skin: Skin is warm.  Psychiatric: He has a normal mood and affect. His behavior is normal. Judgment and thought content normal.  Nursing note and vitals reviewed.   Ortho Exam Left knee exam shows trace effusion.  Collaterals and cruciates are stable.  Lateral joint line tenderness. Specialty Comments:  No specialty comments available.  Imaging: No results found.   PMFS History: Patient Active Problem List   Diagnosis Date Noted  . Preventative health care 11/07/2017  . Anemia 11/07/2017  . Hyperglycemia 11/07/2017  . Prostate cancer ([place]) 11/07/2017  . Hyperlipidemia   . Allergic rhinitis   . History of kidney stones   . OSA on CPAP   . Closed nondisplaced fracture of lateral malleolus of left fibula 11/03/2017  . Normal coronary arteries 09/07/2017  . Bradycardia 09/06/2017  . Hypotension 09/06/2017  . Fibula fracture 09/06/2017  . Lightheadedness 07/01/2012  . Hypertension, uncontrolled 07/01/2012   Past Medical History:  Diagnosis Date  . Allergic rhinitis   . Cancer ([place])   . History of kidney stones   . History of prostate cancer    s/p  radical prostatectomy 2011  . Hyperlipidemia   . Hypertension   . OSA on CPAP    MILD OSA PER STUDY 2013  . Penile lesion   .  Prostate cancer ([place]) 11/07/2017  . Wears hearing aid    BILATERAL    Family History  Problem Relation Age of Onset  . Liver disease Mother   . Cancer Father     Past Surgical History:  Procedure Laterality Date  . CARDIAC CATHETERIZATION  07-01-2012  dr Angelena Form   normal coronary arteries/   ef  60-65%  . INGUINAL HERNIA REPAIR Bilateral as teen  . LEFT HEART CATHETERIZATION WITH CORONARY ANGIOGRAM N/A 07/01/2012   Procedure: LEFT HEART CATHETERIZATION WITH CORONARY ANGIOGRAM;  Surgeon: Wellington Hampshire, MD;  Location: [facility] CATH [facility];  Service: Cardiovascular;  Laterality: N/A;  . LESION DESTRUCTION N/A 05/27/2014   Procedure: EXCISION OF PENIS  LESIONS;  Surgeon: Claybon Jabs, MD;  Location: [facility];  Service: Urology;  Laterality: N/A;  . PROSTATE SURGERY    . ROBOT ASSISTED LAPAROSCOPIC RADICAL PROSTATECTOMY  11-15-2009   right nerve spare/  bilateral pelvic lymphadenectomy   Social History   Occupational History  . Not on file  Tobacco Use  . Smoking status: Former Smoker    Packs/day: 1.00    Years: 4.00    Pack years: 4.00    Types: Cigarettes    Last attempt to quit: 05/20/1973    Years since quitting: 44.7  . Smokeless tobacco: Never Used  Substance and Sexual Activity  . Alcohol use: Yes    Alcohol/week: 2.0 oz    Types: 4 Standard drinks or equivalent per week    Comment: weekends  . Drug use: No  . Sexual activity: Not on file

## 2018-02-24 NOTE — Telephone Encounter (Signed)
Can we just email a "verbal yes"

## 2018-05-28 ENCOUNTER — Telehealth: Payer: Self-pay | Admitting: Internal Medicine

## 2018-05-28 NOTE — Telephone Encounter (Signed)
Copied from [facility] [id number]. Topic: Quick Communication - Rx Refill/Question >> May 28, 2018 11:47 AM Oliver Pila B wrote: Medication: rosuvastatin (CRESTOR) 10 MG tablet [[id number]]   [facility] called to have a PA done on the medication above, contact [phone] to assist  Contact pharmacy if needed

## 2018-05-29 NOTE — Telephone Encounter (Signed)
No record of PA when I try to look at the PA on cover my meds.

## 2018-06-30 ENCOUNTER — Telehealth (INDEPENDENT_AMBULATORY_CARE_PROVIDER_SITE_OTHER): Payer: Self-pay | Admitting: Orthopaedic Surgery

## 2018-06-30 NOTE — Telephone Encounter (Signed)
IC LM for [facility] Rowan advising per Dr Erlinda Hong

## 2018-06-30 NOTE — Telephone Encounter (Signed)
Please advise. Thanks.  

## 2018-06-30 NOTE — Telephone Encounter (Signed)
In terms of his ankle fracture, yes

## 2018-06-30 NOTE — Telephone Encounter (Signed)
Received call from Alen Blew from [facility] asking if patient has been released from care. Hassan Rowan advised will fax over form to be completed by Dr. Erlinda Hong. The number to contact Hassan Rowan is [phone] The fax# is [phone]

## 2018-07-23 ENCOUNTER — Other Ambulatory Visit: Payer: Self-pay | Admitting: Physician Assistant

## 2018-07-23 DIAGNOSIS — I10 Essential (primary) hypertension: Secondary | ICD-10-CM

## 2018-07-23 MED ORDER — LISINOPRIL 40 MG PO TABS: 40.0000 mg | ORAL_TABLET | Freq: Every morning | ORAL | 0 refills | Status: DC

## 2018-09-28 ENCOUNTER — Ambulatory Visit: Payer: Self-pay | Admitting: *Deleted

## 2018-09-28 NOTE — Telephone Encounter (Signed)
Pt and pt's wife calling stating that the pt had a bad headache in the back of his head and has experienced increased BP. While on the phone BP was 157/87 after resting but the first time it was checked the BP was 194/100. Yesterday BP was 176/86 and 158/85. Pt states that while in church on yesterday the pt stated he did not feel good and felt as if if nasal passages were clogged up. Pt's wife reports that the pt did take a cough drop that seemed to help open up his nasal passages and states that it did make him feel better. Pt denies any dizziness or chest pain at this time but does state he has some blurry vision at times. Pt's wife reports that the pt has been taking Lisinopril and and Amlodipine. Pt's wife reports that the pt has experienced pain in the back of his head and not feeling well previously and when his BP is checked sometimes it is high and sometimes it is normal. Pt scheduled per request of pt's wife for earliest OV with Dr. Jenny Reichmann on 10/09/18 but advised that if symptoms become worse to return call to office or to seek treatment in the ED.  Pt's would like to know if it would be possible for the pt to be seen in the office this week, preferably on Friday so that she could come with the pt. Pt can be contacted at [phone] with recommendations.  Reason for Disposition . Systolic BP  >= 280 OR Diastolic >= 034  Answer Assessment - Initial Assessment Questions 1. BLOOD PRESSURE: "What is the blood pressure?" "Did you take at least two measurements 5 minutes apart?"     194/100 and then 157/87 2. ONSET: "When did you take your blood pressure?"     While on the phone with triage nurse 3. HOW: "How did you obtain the blood pressure?" (e.g., visiting nurse, automatic home BP monitor)     automatic 4. HISTORY: "Do you have a history of high blood pressure?"     yes 5. MEDICATIONS: "Are you taking any medications for blood pressure?" "Have you missed any doses recently?"    Yes Lisinopril and  Amlodipine Missed a dose last week due to being out for the holidays no doses missed this week 6. OTHER SYMPTOMS: "Do you have any symptoms?" (e.g., headache, chest pain, blurred vision, difficulty breathing, weakness)   Back of head but not at the friend, changes in vision-blurry pt states if feels like i'm trying to read something but it feels like I need to go get glasses. Pt states he is not able to tell which eyes are blurry and nostrils feeled clogged up on one side  Protocols used: HIGH BLOOD PRESSURE-A-AH

## 2018-09-29 NOTE — Telephone Encounter (Signed)
Please schedule pt for Friday. Thank you!

## 2018-09-29 NOTE — Telephone Encounter (Signed)
Appointment scheduled.

## 2018-10-02 ENCOUNTER — Encounter: Payer: Self-pay | Admitting: Internal Medicine

## 2018-10-02 ENCOUNTER — Ambulatory Visit (INDEPENDENT_AMBULATORY_CARE_PROVIDER_SITE_OTHER)
Admission: RE | Admit: 2018-10-02 | Discharge: 2018-10-02 | Disposition: A | Discharge status: Home / Self Care | Payer: BC Managed Care – PPO | Source: Ambulatory Visit | Attending: Internal Medicine | Admitting: Internal Medicine

## 2018-10-02 ENCOUNTER — Other Ambulatory Visit (INDEPENDENT_AMBULATORY_CARE_PROVIDER_SITE_OTHER): Payer: BC Managed Care – PPO

## 2018-10-02 ENCOUNTER — Ambulatory Visit (INDEPENDENT_AMBULATORY_CARE_PROVIDER_SITE_OTHER): Payer: BC Managed Care – PPO | Admitting: Internal Medicine

## 2018-10-02 VITALS — BP 148/94 | HR 78 | Temp 97.8°F | Ht 73.0 in | Wt 231.0 lb

## 2018-10-02 DIAGNOSIS — I10 Essential (primary) hypertension: Secondary | ICD-10-CM | POA: Diagnosis not present

## 2018-10-02 DIAGNOSIS — H9193 Unspecified hearing loss, bilateral: Secondary | ICD-10-CM

## 2018-10-02 DIAGNOSIS — E785 Hyperlipidemia, unspecified: Secondary | ICD-10-CM

## 2018-10-02 DIAGNOSIS — Z0001 Encounter for general adult medical examination with abnormal findings: Secondary | ICD-10-CM

## 2018-10-02 DIAGNOSIS — M542 Cervicalgia: Secondary | ICD-10-CM | POA: Insufficient documentation

## 2018-10-02 DIAGNOSIS — C61 Malignant neoplasm of prostate: Secondary | ICD-10-CM

## 2018-10-02 DIAGNOSIS — R42 Dizziness and giddiness: Secondary | ICD-10-CM | POA: Diagnosis not present

## 2018-10-02 DIAGNOSIS — R739 Hyperglycemia, unspecified: Secondary | ICD-10-CM

## 2018-10-02 DIAGNOSIS — G4733 Obstructive sleep apnea (adult) (pediatric): Secondary | ICD-10-CM

## 2018-10-02 DIAGNOSIS — Z0000 Encounter for general adult medical examination without abnormal findings: Secondary | ICD-10-CM

## 2018-10-02 DIAGNOSIS — Z9989 Dependence on other enabling machines and devices: Secondary | ICD-10-CM

## 2018-10-02 HISTORY — DX: Unspecified hearing loss, bilateral: H91.93

## 2018-10-02 LAB — CBC WITH DIFFERENTIAL/PLATELET
Basophils Absolute: 0 10*3/uL (ref 0.0–0.1)
Basophils Relative: 0.8 % (ref 0.0–3.0)
Eosinophils Absolute: 0.2 10*3/uL (ref 0.0–0.7)
Eosinophils Relative: 3.5 % (ref 0.0–5.0)
HCT: 45.2 % (ref 39.0–52.0)
Hemoglobin: 15.1 g/dL (ref 13.0–17.0)
Lymphocytes Relative: 35.2 % (ref 12.0–46.0)
Lymphs Abs: 2.1 10*3/uL (ref 0.7–4.0)
MCHC: 33.4 g/dL (ref 30.0–36.0)
MCV: 91 fl (ref 78.0–100.0)
Monocytes Absolute: 0.6 10*3/uL (ref 0.1–1.0)
Monocytes Relative: 9.8 % (ref 3.0–12.0)
Neutro Abs: 3 10*3/uL (ref 1.4–7.7)
Neutrophils Relative %: 50.7 % (ref 43.0–77.0)
Platelets: 212 10*3/uL (ref 150.0–400.0)
RBC: 4.96 Mil/uL (ref 4.22–5.81)
RDW: 15 % (ref 11.5–15.5)
WBC: 5.9 10*3/uL (ref 4.0–10.5)

## 2018-10-02 LAB — LIPID PANEL
Cholesterol: 241 mg/dL — ABNORMAL HIGH (ref 0–200)
HDL: 48.6 mg/dL (ref >39.00)
NonHDL: 191.97
Total CHOL/HDL Ratio: 5
Triglycerides: 207 mg/dL — ABNORMAL HIGH (ref 0.0–149.0)
VLDL: 41.4 mg/dL — ABNORMAL HIGH (ref 0.0–40.0)

## 2018-10-02 LAB — TSH: TSH: 0.46 u[IU]/mL (ref 0.35–4.50)

## 2018-10-02 LAB — LDL CHOLESTEROL, DIRECT: LDL DIRECT: 166 mg/dL

## 2018-10-02 LAB — BASIC METABOLIC PANEL
BUN: 14 mg/dL (ref 6–23)
CO2: 27 mEq/L (ref 19–32)
CREATININE: 1.09 mg/dL (ref 0.40–1.50)
Calcium: 9.8 mg/dL (ref 8.4–10.5)
Chloride: 101 mEq/L (ref 96–112)
GFR: 85.83 mL/min (ref >60.00)
Glucose, Bld: 101 mg/dL — ABNORMAL HIGH (ref 70–99)
Potassium: 4.3 mEq/L (ref 3.5–5.1)
Sodium: 139 mEq/L (ref 135–145)

## 2018-10-02 LAB — URINALYSIS, ROUTINE W REFLEX MICROSCOPIC
Bilirubin Urine: NEGATIVE
Hgb urine dipstick: NEGATIVE
Ketones, ur: NEGATIVE
Leukocytes, UA: NEGATIVE
Nitrite: NEGATIVE
RBC / HPF: NONE SEEN (ref 0–?)
Specific Gravity, Urine: 1.015 (ref 1.000–1.030)
Total Protein, Urine: NEGATIVE
Urine Glucose: NEGATIVE
Urobilinogen, UA: 0.2 (ref 0.0–1.0)
WBC, UA: NONE SEEN (ref 0–?)
pH: 6.5 (ref 5.0–8.0)

## 2018-10-02 LAB — HEPATIC FUNCTION PANEL
ALBUMIN: 4.6 g/dL (ref 3.5–5.2)
ALT: 15 U/L (ref 0–53)
AST: 17 U/L (ref 0–37)
Alkaline Phosphatase: 67 U/L (ref 39–117)
Bilirubin, Direct: 0.1 mg/dL (ref 0.0–0.3)
Total Bilirubin: 0.7 mg/dL (ref 0.2–1.2)
Total Protein: 7.3 g/dL (ref 6.0–8.3)

## 2018-10-02 LAB — HEMOGLOBIN A1C: Hgb A1c MFr Bld: 5.9 % (ref 4.6–6.5)

## 2018-10-02 LAB — PSA: PSA: 0 ng/mL — ABNORMAL LOW (ref 0.10–4.00)

## 2018-10-02 MED ORDER — MELOXICAM 7.5 MG PO TABS: 7.5000 mg | ORAL_TABLET | Freq: Every day | ORAL | 3 refills | Status: DC | PRN

## 2018-10-02 MED ORDER — ROSUVASTATIN CALCIUM 10 MG PO TABS: 10.0000 mg | ORAL_TABLET | Freq: Every day | ORAL | 3 refills | Status: DC

## 2018-10-02 MED ORDER — LISINOPRIL 40 MG PO TABS: 40.0000 mg | ORAL_TABLET | Freq: Every morning | ORAL | 3 refills | Status: DC

## 2018-10-02 MED ORDER — AMLODIPINE BESYLATE 10 MG PO TABS: 10.0000 mg | ORAL_TABLET | Freq: Every day | ORAL | 3 refills | Status: DC

## 2018-10-02 NOTE — Patient Instructions (Signed)
Please take all new medication as prescribed - the meloxicam 7.5 mg daily as needed  OK to start the crestor 10 mg per day for high cholesterol  Please continue all other medications as before, and refills have been done if requested, including the blood pressure medications aat the current doses  Please have the pharmacy call with any other refills you may need.  Please continue your efforts at being more active, low cholesterol diet, and weight control.  You are otherwise up to date with prevention measures today.  Please keep your appointments with your specialists as you may have planned  Please go to the XRAY Department in the Basement (go straight as you get off the elevator) for the x-ray testing  Please go to the LAB in the Basement (turn left off the elevator) for the tests to be done today  You will be contacted by phone if any changes need to be made immediately.  Otherwise, you will receive a letter about your results with an explanation, but please check with MyChart first.  Please remember to sign up for MyChart if you have not done so, as this will be important to you in the future with finding out test results, communicating by private email, and scheduling acute appointments online when needed.  Please return in 1 year for your yearly visit, or sooner if needed, with Lab testing done 3-5 days before

## 2018-10-02 NOTE — Progress Notes (Signed)
Subjective:    Patient ID: Dalton Martin, male    DOB: 10/25/1945, 73 y.o.   MRN: [id number]  HPI  Here for wellness and f/u;  Overall doing ok;  Pt denies Chest pain, worsening SOB, DOE, wheezing, orthopnea, PND, worsening LE edema, palpitations, dizziness or syncope.  Pt denies neurological change such as new headache, facial or extremity weakness.  Pt denies polydipsia, polyuria, or low sugar symptoms. Pt states overall good compliance with treatment and medications, good tolerability, and has been trying to follow appropriate diet.  Pt denies worsening depressive symptoms, suicidal ideation or panic. No fever, night sweats, wt loss, loss of appetite, or other constitutional symptoms.  Pt states good ability with ADL's, has low fall risk, home safety reviewed and adequate, no other significant changes in hearing or vision, and only occasionally active with exercise. Due for colonoscopy and ok for schedule.  Not using CPAP, declines retesting or tx for now.  BP at home < 140/90.  Last PSA 1 yr ago; s/p prostatectomy 2011 and not asked to f/u after about 5 yrs.   Gained significant wt in the past yr due to less activity after broken leg accident.  Just now started back to the gym to walk the treadmill this week.   Wt Readings from Last 3 Encounters:  10/02/18 231 lb (104.8 kg)  11/07/17 217 lb (98.4 kg)  09/15/17 218 lb (98.9 kg)  Is now willing to try statin, and follow low chol diet.  Does have recurring episodes that are concerning to nurse wife of lightheadedness and weakness spells, but no evidence for low BP, has seen cardiology with reported neg cardiac monitor.  Does also have several months mild recurring post midline neck pain with radiation to the back of the head worse with movement and wife has noticed some decreased ROM on horizontal movements.  No other neck radicular symptoms. Past Medical History:  Diagnosis Date  . Allergic rhinitis   . Bilateral hearing loss 10/02/2018  . Cancer  ([place])   . History of kidney stones   . History of prostate cancer    s/p  radical prostatectomy 2011  . Hyperlipidemia   . Hypertension   . OSA on CPAP    MILD OSA PER STUDY 2013  . Penile lesion   . Prostate cancer ([facility]) 11/07/2017  . Wears hearing aid    BILATERAL   Past Surgical History:  Procedure Laterality Date  . CARDIAC CATHETERIZATION  07-01-2012  dr Angelena Form   normal coronary arteries/   ef  60-65%  . INGUINAL HERNIA REPAIR Bilateral as teen  . LEFT HEART CATHETERIZATION WITH CORONARY ANGIOGRAM N/A 07/01/2012   Procedure: LEFT HEART CATHETERIZATION WITH CORONARY ANGIOGRAM;  Surgeon: Wellington Hampshire, MD;  Location: [facility] CATH [facility];  Service: Cardiovascular;  Laterality: N/A;  . LESION DESTRUCTION N/A 05/27/2014   Procedure: EXCISION OF PENIS LESIONS;  Surgeon: Claybon Jabs, MD;  Location: [facility];  Service: Urology;  Laterality: N/A;  . PROSTATE SURGERY    . ROBOT ASSISTED LAPAROSCOPIC RADICAL PROSTATECTOMY  11-15-2009   right nerve spare/  bilateral pelvic lymphadenectomy    reports that he quit smoking about 45 years ago. His smoking use included cigarettes. He has a 4.00 pack-year smoking history. He has never used smokeless tobacco. He reports current alcohol use of about 4.0 standard drinks of alcohol per week. He reports that he does not use drugs. family history includes Cancer in his father; Liver disease in his  mother. No Known Allergies Current Outpatient Medications on File Prior to Visit  Medication Sig Dispense Refill  . aspirin EC 81 MG EC tablet Take 1 tablet (81 mg total) by mouth daily. 30 tablet 0  . cholecalciferol (VITAMIN D) 1000 UNITS tablet Take 1,000 Units by mouth daily. Reported on 12/19/2015    . clotrimazole-betamethasone (LOTRISONE) cream Apply topically.    . Cyanocobalamin (VITAMIN B-12 PO) Take 1 tablet by mouth daily.    . fluticasone (FLONASE) 50 MCG/ACT nasal spray Place 1 spray into both nostrils daily. 48 g 3  . Multiple  Vitamins-Minerals (MENS MULTIVITAMIN PLUS PO) Take 1 tablet by mouth daily.    Marland Kitchen senna-docusate (SENOKOT-S) 8.6-50 MG tablet Take 1 tablet by mouth at bedtime as needed for mild constipation. 30 tablet 0  . triamcinolone cream (KENALOG) 0.1 % Apply 1 application topically 2 (two) times daily as needed (rash).     . Vitamin D, Ergocalciferol, (DRISDOL) 50000 units CAPS capsule Take 1 capsule (50,000 Units total) by mouth every 7 (seven) days. 6 capsule 0   No current facility-administered medications on file prior to visit.    Review of Systems Constitutional: Negative for other unusual diaphoresis, sweats, appetite or weight changes HENT: Negative for other worsening hearing loss, ear pain, facial swelling, mouth sores or neck stiffness.   Eyes: Negative for other worsening pain, redness or other visual disturbance.  Respiratory: Negative for other stridor or swelling Cardiovascular: Negative for other palpitations or other chest pain  Gastrointestinal: Negative for worsening diarrhea or loose stools, blood in stool, distention or other pain Genitourinary: Negative for hematuria, flank pain or other change in urine volume.  Musculoskeletal: Negative for myalgias or other joint swelling.  Skin: Negative for other color change, or other wound or worsening drainage.  Neurological: Negative for other syncope or numbness. Hematological: Negative for other adenopathy or swelling Psychiatric/Behavioral: Negative for hallucinations, other worsening agitation, SI, self-injury, or new decreased concentration All other system neg per    Objective:   Physical Exam BP (!) 148/94   Pulse 78   Temp 97.8 F (36.6 C) (Oral)   Ht 6\' 1"  (1.854 m)   Wt 231 lb (104.8 kg)   SpO2 95%   BMI 30.48 kg/m  VS noted,  Constitutional: Pt is oriented to person, place, and time. Appears well-developed and well-nourished, in no significant distress and comfortable Head: Normocephalic and atraumatic  Eyes:  Conjunctivae and EOM are normal. Pupils are equal, round, and reactive to light Right Ear: External ear normal without discharge Left Ear: External ear normal without discharge Nose: Nose without discharge or deformity Mouth/Throat: Oropharynx is without other ulcerations and moist  Neck: decreased horiztal range of motion by about 10 degrees left and right. Neck supple. No JVD present. No tracheal deviation present or significant neck LA or mass.   Cardiovascular: Normal rate, regular rhythm, normal heart sounds and intact distal pulses.   Pulmonary/Chest: WOB normal and breath sounds without rales or wheezing  Abdominal: Soft. Bowel sounds are normal. NT. No HSM  Musculoskeletal: Normal range of motion. Exhibits no edema Lymphadenopathy: Has no other cervical adenopathy.  Neurological: Pt is alert and oriented to person, place, and time. Pt has normal reflexes. No cranial nerve deficit. Motor grossly intact, Gait intact Skin: Skin is warm and dry. No rash noted or new ulcerations Psychiatric:  Has normal mood and affect. Behavior is normal without agitation No other exam findings Lab Results  Component Value Date   WBC 5.9 10/02/2018  HGB 15.1 10/02/2018   HCT 45.2 10/02/2018   PLT 212.0 10/02/2018   GLUCOSE 101 (H) 10/02/2018   CHOL 241 (H) 10/02/2018   TRIG 207.0 (H) 10/02/2018   HDL 48.60 10/02/2018   LDLDIRECT 166.0 10/02/2018   LDLCALC 136 (H) 11/07/2017   ALT 15 10/02/2018   AST 17 10/02/2018   NA 139 10/02/2018   K 4.3 10/02/2018   CL 101 10/02/2018   CREATININE 1.09 10/02/2018   BUN 14 10/02/2018   CO2 27 10/02/2018   TSH 0.46 10/02/2018   PSA 0.00 (L) 10/02/2018   INR 1.07 07/01/2012   HGBA1C 5.9 10/02/2018        Assessment & Plan:

## 2018-10-03 NOTE — Assessment & Plan Note (Signed)
For crestor start as now willing to do so, follow lower chol diet

## 2018-10-03 NOTE — Assessment & Plan Note (Addendum)
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

## 2018-10-03 NOTE — Assessment & Plan Note (Signed)
Stable, for f/u lab today 

## 2018-10-03 NOTE — Assessment & Plan Note (Signed)
Exam o/w benign stable overall by history and exam, recent data reviewed with pt, and pt to continue medical treatment as before,  to f/u any worsening symptoms or concerns

## 2018-10-03 NOTE — Assessment & Plan Note (Signed)
stable overall by history and exam, recent data reviewed with pt, and pt to continue medical treatment as before,  to f/u any worsening symptoms or concerns  

## 2018-10-03 NOTE — Assessment & Plan Note (Signed)
Declines f/u to pulm referral today

## 2018-10-03 NOTE — Assessment & Plan Note (Signed)

## 2018-10-09 ENCOUNTER — Ambulatory Visit: Payer: Medicare Other | Admitting: Internal Medicine

## 2018-10-12 ENCOUNTER — Telehealth: Payer: Self-pay | Admitting: Internal Medicine

## 2018-10-12 MED ORDER — FLUTICASONE PROPIONATE 50 MCG/ACT NA SUSP: 1.0000 | Freq: Every day | NASAL | 3 refills | Status: DC

## 2018-10-12 NOTE — Telephone Encounter (Signed)
Pt would like a refill of his Fluticasone, please send to [facility] on file

## 2018-11-05 ENCOUNTER — Ambulatory Visit
Admission: EM | Admit: 2018-11-05 | Discharge: 2018-11-05 | Disposition: A | Discharge status: Home / Self Care | Payer: BC Managed Care – PPO | Attending: Family Medicine | Admitting: Family Medicine

## 2018-11-05 DIAGNOSIS — J069 Acute upper respiratory infection, unspecified: Secondary | ICD-10-CM | POA: Diagnosis not present

## 2018-11-05 DIAGNOSIS — B9789 Other viral agents as the cause of diseases classified elsewhere: Secondary | ICD-10-CM

## 2018-11-05 MED ORDER — CETIRIZINE HCL 5 MG PO TABS: 5.0000 mg | ORAL_TABLET | Freq: Every day | ORAL | 0 refills | Status: AC

## 2018-11-05 MED ORDER — BENZONATATE 100 MG PO CAPS: 100.0000 mg | ORAL_CAPSULE | Freq: Three times a day (TID) | ORAL | 0 refills | Status: DC

## 2018-11-05 MED ORDER — FLUTICASONE PROPIONATE 50 MCG/ACT NA SUSP: 2.0000 | Freq: Every day | NASAL | 0 refills | Status: AC

## 2018-11-05 NOTE — ED Triage Notes (Signed)
Pt c/o nasal/head congestion x2wks with a productive cough at times

## 2018-11-05 NOTE — Discharge Instructions (Signed)
Get plenty of rest and push fluids Tessalon Perles prescribed for cough Zyrtec prescribed for nasal congestion, runny nose, and/or sore throat Flonase prescribed for nasal congestion and runny nose Use medications daily for symptom relief Use OTC medications like ibuprofen or tylenol as needed fever or pain Follow up with PCP if symptoms persist Return or go to ER if you have any new or worsening symptoms fever, chills, nausea, vomiting, chest pain, cough, shortness of breath, wheezing, abdominal pain, changes in bowel or bladder habits, etc..Marland Kitchen

## 2018-11-05 NOTE — ED Provider Notes (Signed)
[facility]   [id number] 11/05/18 Arrival Time: 1013   CC: URI symptoms   SUBJECTIVE: History from: patient.  Dalton Martin is a 73 y.o. male who presents with abrupt onset of nasal congestion, fatigue, productive cough x 2 weeks with worsening symptoms in the last few days.  Admits to sick exposure to wife with similar symptoms.  Has tried OTC medications without relief. Symptoms made worse with breathing through nose. Complains of subjective fever as well, and difficulty breathing through nose.  Denies chills, sinus pain, rhinorrhea, sore throat, SOB, wheezing, chest pain, nausea, changes in bowel or bladder habits.    Received flu shot this year: yes.  ROS: As per HPI.  Past Medical History:  Diagnosis Date  . Allergic rhinitis   . Bilateral hearing loss 10/02/2018  . Cancer ([place])   . History of kidney stones   . History of prostate cancer    s/p  radical prostatectomy 2011  . Hyperlipidemia   . Hypertension   . OSA on CPAP    MILD OSA PER STUDY 2013  . Penile lesion   . Prostate cancer ([place]) 11/07/2017  . Wears hearing aid    BILATERAL   Past Surgical History:  Procedure Laterality Date  . CARDIAC CATHETERIZATION  07-01-2012  dr Angelena Form   normal coronary arteries/   ef  60-65%  . INGUINAL HERNIA REPAIR Bilateral as teen  . LEFT HEART CATHETERIZATION WITH CORONARY ANGIOGRAM N/A 07/01/2012   Procedure: LEFT HEART CATHETERIZATION WITH CORONARY ANGIOGRAM;  Surgeon: Wellington Hampshire, MD;  Location: [facility] CATH LAB;  Service: Cardiovascular;  Laterality: N/A;  . LESION DESTRUCTION N/A 05/27/2014   Procedure: EXCISION OF PENIS LESIONS;  Surgeon: Claybon Jabs, MD;  Location: [facility];  Service: Urology;  Laterality: N/A;  . PROSTATE SURGERY    . ROBOT ASSISTED LAPAROSCOPIC RADICAL PROSTATECTOMY  11-15-2009   right nerve spare/  bilateral pelvic lymphadenectomy   No Known Allergies No current facility-administered medications on file prior to  encounter.    Current Outpatient Medications on File Prior to Encounter  Medication Sig Dispense Refill  . amLODipine (NORVASC) 10 MG tablet Take 1 tablet (10 mg total) by mouth daily. 90 tablet 3  . aspirin EC 81 MG EC tablet Take 1 tablet (81 mg total) by mouth daily. 30 tablet 0  . cholecalciferol (VITAMIN D) 1000 UNITS tablet Take 1,000 Units by mouth daily. Reported on 12/19/2015    . clotrimazole-betamethasone (LOTRISONE) cream Apply topically.    . Cyanocobalamin (VITAMIN B-12 PO) Take 1 tablet by mouth daily.    Marland Kitchen lisinopril (PRINIVIL,ZESTRIL) 40 MG tablet Take 1 tablet (40 mg total) by mouth every morning. Please make yearly appt with Doctor for December before anymore refills. 1st attempt 90 tablet 3  . meloxicam (MOBIC) 7.5 MG tablet Take 1 tablet (7.5 mg total) by mouth daily as needed for pain. 90 tablet 3  . Multiple Vitamins-Minerals (MENS MULTIVITAMIN PLUS PO) Take 1 tablet by mouth daily.    . rosuvastatin (CRESTOR) 10 MG tablet Take 1 tablet (10 mg total) by mouth daily. 90 tablet 3  . senna-docusate (SENOKOT-S) 8.6-50 MG tablet Take 1 tablet by mouth at bedtime as needed for mild constipation. 30 tablet 0  . triamcinolone cream (KENALOG) 0.1 % Apply 1 application topically 2 (two) times daily as needed (rash).     . Vitamin D, Ergocalciferol, (DRISDOL) 50000 units CAPS capsule Take 1 capsule (50,000 Units total) by mouth every 7 (seven) days.  6 capsule 0   Social History   Socioeconomic History  . Marital status: Married    Spouse name: Not on file  . Number of children: Not on file  . Years of education: Not on file  . Highest education level: Not on file  Occupational History  . Not on file  Social Needs  . Financial resource strain: Not on file  . Food insecurity:    Worry: Not on file    Inability: Not on file  . Transportation needs:    Medical: Not on file    Non-medical: Not on file  Tobacco Use  . Smoking status: Former Smoker    Packs/day: 1.00     Years: 4.00    Pack years: 4.00    Types: Cigarettes    Last attempt to quit: 05/20/1973    Years since quitting: 45.4  . Smokeless tobacco: Never Used  Substance and Sexual Activity  . Alcohol use: Yes    Alcohol/week: 4.0 standard drinks    Types: 4 Standard drinks or equivalent per week    Comment: weekends  . Drug use: No  . Sexual activity: Not on file  Lifestyle  . Physical activity:    Days per week: Not on file    Minutes per session: Not on file  . Stress: Not on file  Relationships  . Social connections:    Talks on phone: Not on file    Gets together: Not on file    Attends religious service: Not on file    Active member of club or organization: Not on file    Attends meetings of clubs or organizations: Not on file    Relationship status: Not on file  . Intimate partner violence:    Fear of current or ex partner: Not on file    Emotionally abused: Not on file    Physically abused: Not on file    Forced sexual activity: Not on file  Other Topics Concern  . Not on file  Social History Narrative  . Not on file   Family History  Problem Relation Age of Onset  . Liver disease Mother   . Cancer Father     OBJECTIVE:  Vitals:   11/05/18 1035  BP: (!) 152/90  Pulse: 79  Temp: 98.5 F (36.9 C)  TempSrc: Oral  SpO2: 96%     General appearance: alert; appears mildly fatigued, but nontoxic; speaking in full sentences and tolerating own secretions HEENT: NCAT; Ears: EACs clear, TMs pearly gray; Eyes: PERRL.  EOM grossly intact. Sinuses: nontender; Nose: nares patent without rhinorrhea, turbinates mildly swollen and erythematous; Throat: oropharynx clear, tonsils non erythematous or enlarged, uvula midline  Neck: supple without LAD Lungs: unlabored respirations, symmetrical air entry; cough: absent, mild; no respiratory distress; CTAB Heart: regular rate and rhythm.  Radial pulses 2+ symmetrical bilaterally Skin: warm and dry Psychological: alert and  cooperative; normal mood and affect  ASSESSMENT & PLAN:  1. Viral URI with cough     Meds ordered this encounter  Medications  . cetirizine (ZYRTEC) 5 MG tablet    Sig: Take 1 tablet (5 mg total) by mouth daily.    Dispense:  20 tablet    Refill:  0    Order Specific Question:   Supervising Provider    Answer:   Raylene Everts [[id number]]  . fluticasone (FLONASE) 50 MCG/ACT nasal spray    Sig: Place 2 sprays into both nostrils daily.    Dispense:  16  g    Refill:  0    Order Specific Question:   Supervising Provider    Answer:   Raylene Everts [[id number]]  . benzonatate (TESSALON) 100 MG capsule    Sig: Take 1 capsule (100 mg total) by mouth every 8 (eight) hours.    Dispense:  21 capsule    Refill:  0    Order Specific Question:   Supervising Provider    Answer:   Raylene Everts [[id number]]    Get plenty of rest and push fluids Tessalon Perles prescribed for cough Zyrtec prescribed for nasal congestion, runny nose, and/or sore throat Flonase prescribed for nasal congestion and runny nose Use medications daily for symptom relief Use OTC medications like ibuprofen or tylenol as needed fever or pain Follow up with PCP if symptoms persist Return or go to ER if you have any new or worsening symptoms fever, chills, nausea, vomiting, chest pain, cough, shortness of breath, wheezing, abdominal pain, changes in bowel or bladder habits, etc...  Reviewed expectations re: course of current medical issues. Questions answered. Outlined signs and symptoms indicating need for more acute intervention. Patient verbalized understanding. After Visit Summary given.         Lestine Box, PA-C 11/05/18 1933

## 2018-11-11 ENCOUNTER — Ambulatory Visit (INDEPENDENT_AMBULATORY_CARE_PROVIDER_SITE_OTHER): Payer: BC Managed Care – PPO | Admitting: Internal Medicine

## 2018-11-11 ENCOUNTER — Encounter: Payer: Self-pay | Admitting: Internal Medicine

## 2018-11-11 VITALS — BP 136/88 | HR 79 | Temp 98.1°F | Ht 73.0 in | Wt 235.0 lb

## 2018-11-11 DIAGNOSIS — R739 Hyperglycemia, unspecified: Secondary | ICD-10-CM | POA: Diagnosis not present

## 2018-11-11 DIAGNOSIS — Z23 Encounter for immunization: Secondary | ICD-10-CM

## 2018-11-11 DIAGNOSIS — I10 Essential (primary) hypertension: Secondary | ICD-10-CM

## 2018-11-11 DIAGNOSIS — E785 Hyperlipidemia, unspecified: Secondary | ICD-10-CM

## 2018-11-11 DIAGNOSIS — Z0000 Encounter for general adult medical examination without abnormal findings: Secondary | ICD-10-CM | POA: Diagnosis not present

## 2018-11-11 NOTE — Progress Notes (Signed)
Subjective:    Patient ID: Dalton Martin, male    DOB: 17-Nov-1945, 73 y.o.   MRN: [id number]  HPI  Here for wellness and f/u;  Overall doing ok;  Pt denies Chest pain, worsening SOB, DOE, wheezing, orthopnea, PND, worsening LE edema, palpitations, dizziness or syncope.  Pt denies neurological change such as new headache, facial or extremity weakness.  Pt denies polydipsia, polyuria, or low sugar symptoms. Pt states overall good compliance with treatment and medications, good tolerability, and has been trying to follow appropriate diet.  Pt denies worsening depressive symptoms, suicidal ideation or panic. No fever, night sweats, wt loss, loss of appetite, or other constitutional symptoms.  Pt states good ability with ADL's, has low fall risk, home safety reviewed and adequate, no other significant changes in hearing or vision, and only occasionally active with exercise. Due for pneumovax.  Wt Readings from Last 3 Encounters:  11/11/18 235 lb (106.6 kg)  10/02/18 231 lb (104.8 kg)  11/07/17 217 lb (98.4 kg)  No new complaints Past Medical History:  Diagnosis Date  . Allergic rhinitis   . Bilateral hearing loss 10/02/2018  . Cancer ([place])   . History of kidney stones   . History of prostate cancer    s/p  radical prostatectomy 2011  . Hyperlipidemia   . Hypertension   . OSA on CPAP    MILD OSA PER STUDY 2013  . Penile lesion   . Prostate cancer ([place]) 11/07/2017  . Wears hearing aid    BILATERAL   Past Surgical History:  Procedure Laterality Date  . CARDIAC CATHETERIZATION  07-01-2012  dr Angelena Form   normal coronary arteries/   ef  60-65%  . INGUINAL HERNIA REPAIR Bilateral as teen  . LEFT HEART CATHETERIZATION WITH CORONARY ANGIOGRAM N/A 07/01/2012   Procedure: LEFT HEART CATHETERIZATION WITH CORONARY ANGIOGRAM;  Surgeon: Wellington Hampshire, MD;  Location: [facility] CATH LAB;  Service: Cardiovascular;  Laterality: N/A;  . LESION DESTRUCTION N/A 05/27/2014   Procedure: EXCISION OF PENIS LESIONS;   Surgeon: Claybon Jabs, MD;  Location: [facility];  Service: Urology;  Laterality: N/A;  . PROSTATE SURGERY    . ROBOT ASSISTED LAPAROSCOPIC RADICAL PROSTATECTOMY  11-15-2009   right nerve spare/  bilateral pelvic lymphadenectomy    reports that he quit smoking about 45 years ago. His smoking use included cigarettes. He has a 4.00 pack-year smoking history. He has never used smokeless tobacco. He reports current alcohol use of about 4.0 standard drinks of alcohol per week. He reports that he does not use drugs. family history includes Cancer in his father; Liver disease in his mother. No Known Allergies Current Outpatient Medications on File Prior to Visit  Medication Sig Dispense Refill  . amLODipine (NORVASC) 10 MG tablet Take 1 tablet (10 mg total) by mouth daily. 90 tablet 3  . aspirin EC 81 MG EC tablet Take 1 tablet (81 mg total) by mouth daily. 30 tablet 0  . benzonatate (TESSALON) 100 MG capsule Take 1 capsule (100 mg total) by mouth every 8 (eight) hours. 21 capsule 0  . cetirizine (ZYRTEC) 5 MG tablet Take 1 tablet (5 mg total) by mouth daily. 20 tablet 0  . cholecalciferol (VITAMIN D) 1000 UNITS tablet Take 1,000 Units by mouth daily. Reported on 12/19/2015    . clotrimazole-betamethasone (LOTRISONE) cream Apply topically.    . Cyanocobalamin (VITAMIN B-12 PO) Take 1 tablet by mouth daily.    . fluticasone (FLONASE) 50 MCG/ACT nasal spray  Place 2 sprays into both nostrils daily. 16 g 0  . lisinopril (PRINIVIL,ZESTRIL) 40 MG tablet Take 1 tablet (40 mg total) by mouth every morning. Please make yearly appt with Doctor for December before anymore refills. 1st attempt 90 tablet 3  . meloxicam (MOBIC) 7.5 MG tablet Take 1 tablet (7.5 mg total) by mouth daily as needed for pain. 90 tablet 3  . Multiple Vitamins-Minerals (MENS MULTIVITAMIN PLUS PO) Take 1 tablet by mouth daily.    . rosuvastatin (CRESTOR) 10 MG tablet Take 1 tablet (10 mg total) by mouth daily. 90 tablet 3    . senna-docusate (SENOKOT-S) 8.6-50 MG tablet Take 1 tablet by mouth at bedtime as needed for mild constipation. 30 tablet 0  . triamcinolone cream (KENALOG) 0.1 % Apply 1 application topically 2 (two) times daily as needed (rash).     . Vitamin D, Ergocalciferol, (DRISDOL) 50000 units CAPS capsule Take 1 capsule (50,000 Units total) by mouth every 7 (seven) days. 6 capsule 0   No current facility-administered medications on file prior to visit.    Review of Systems Constitutional: Negative for other unusual diaphoresis, sweats, appetite or weight changes HENT: Negative for other worsening hearing loss, ear pain, facial swelling, mouth sores or neck stiffness.   Eyes: Negative for other worsening pain, redness or other visual disturbance.  Respiratory: Negative for other stridor or swelling Cardiovascular: Negative for other palpitations or other chest pain  Gastrointestinal: Negative for worsening diarrhea or loose stools, blood in stool, distention or other pain Genitourinary: Negative for hematuria, flank pain or other change in urine volume.  Musculoskeletal: Negative for myalgias or other joint swelling.  Skin: Negative for other color change, or other wound or worsening drainage.  Neurological: Negative for other syncope or numbness. Hematological: Negative for other adenopathy or swelling Psychiatric/Behavioral: Negative for hallucinations, other worsening agitation, SI, self-injury, or new decreased concentration All other system neg per pt    Objective:   Physical Exam BP 136/88   Pulse 79   Temp 98.1 F (36.7 C) (Oral)   Ht 6\' 1"  (1.854 m)   Wt 235 lb (106.6 kg)   SpO2 96%   BMI 31.00 kg/m  VS noted,  Constitutional: Pt is oriented to person, place, and time. Appears well-developed and well-nourished, in no significant distress and comfortable Head: Normocephalic and atraumatic  Eyes: Conjunctivae and EOM are normal. Pupils are equal, round, and reactive to light Right  Ear: External ear normal without discharge Left Ear: External ear normal without discharge Nose: Nose without discharge or deformity Mouth/Throat: Oropharynx is without other ulcerations and moist  Neck: Normal range of motion. Neck supple. No JVD present. No tracheal deviation present or significant neck LA or mass Cardiovascular: Normal rate, regular rhythm, normal heart sounds and intact distal pulses.   Pulmonary/Chest: WOB normal and breath sounds without rales or wheezing  Abdominal: Soft. Bowel sounds are normal. NT. No HSM  Musculoskeletal: Normal range of motion. Exhibits no edema Lymphadenopathy: Has no other cervical adenopathy.  Neurological: Pt is alert and oriented to person, place, and time. Pt has normal reflexes. No cranial nerve deficit. Motor grossly intact, Gait intact Skin: Skin is warm and dry. No rash noted or new ulcerations Psychiatric:  Has normal mood and affect. Behavior is normal without agitation No other exam findings Lab Results  Component Value Date   WBC 5.9 10/02/2018   HGB 15.1 10/02/2018   HCT 45.2 10/02/2018   PLT 212.0 10/02/2018   GLUCOSE 101 (H) 10/02/2018  CHOL 241 (H) 10/02/2018   TRIG 207.0 (H) 10/02/2018   HDL 48.60 10/02/2018   LDLDIRECT 166.0 10/02/2018   LDLCALC 136 (H) 11/07/2017   ALT 15 10/02/2018   AST 17 10/02/2018   NA 139 10/02/2018   K 4.3 10/02/2018   CL 101 10/02/2018   CREATININE 1.09 10/02/2018   BUN 14 10/02/2018   CO2 27 10/02/2018   TSH 0.46 10/02/2018   PSA 0.00 (L) 10/02/2018   INR 1.07 07/01/2012   HGBA1C 5.9 10/02/2018       Assessment & Plan:

## 2018-11-11 NOTE — Patient Instructions (Addendum)
You had the Pneumovax pneumonia shot today  Please continue all other medications as before, and refills have been done if requested.  Please have the pharmacy call with any other refills you may need.  Please continue your efforts at being more active, low cholesterol diet, and weight control.  You are otherwise up to date with prevention measures today.  Please keep your appointments with your specialists as you may have planned  Please return in 1 year for your yearly visit, or sooner if needed, with Lab testing done 3-5 days before

## 2018-11-12 NOTE — Assessment & Plan Note (Signed)
stable overall by history and exam, recent data reviewed with pt, and pt to continue medical treatment as before,  to f/u any worsening symptoms or concerns  

## 2018-11-12 NOTE — Assessment & Plan Note (Signed)

## 2018-11-12 NOTE — Assessment & Plan Note (Signed)
stable overall by history and exam, recent data reviewed with pt, and pt to continue medical treatment as before,  to f/u any worsening symptoms or concerns, for lipids with labs 

## 2018-11-16 ENCOUNTER — Encounter: Payer: Self-pay | Admitting: Internal Medicine

## 2018-12-09 IMAGING — CR DG KNEE COMPLETE 4+V*L*
4 series · 4 of 4 positions shown · non-contrast
Comparison: None.

CLINICAL DATA: 71 y/o  M; fall with pain.

EXAM:
LEFT KNEE - COMPLETE 4+ VIEW

[knee ap]
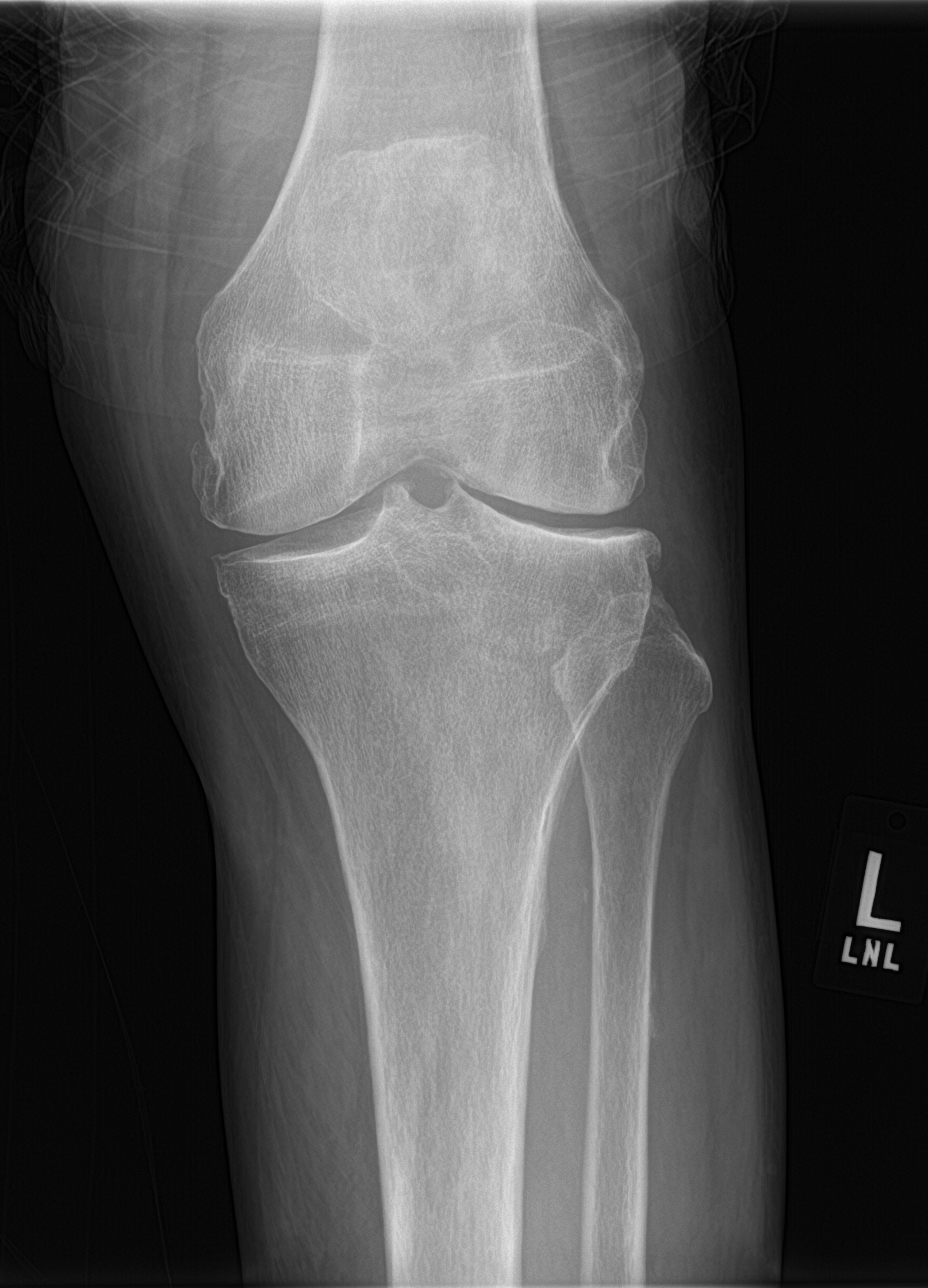

[knee lat]
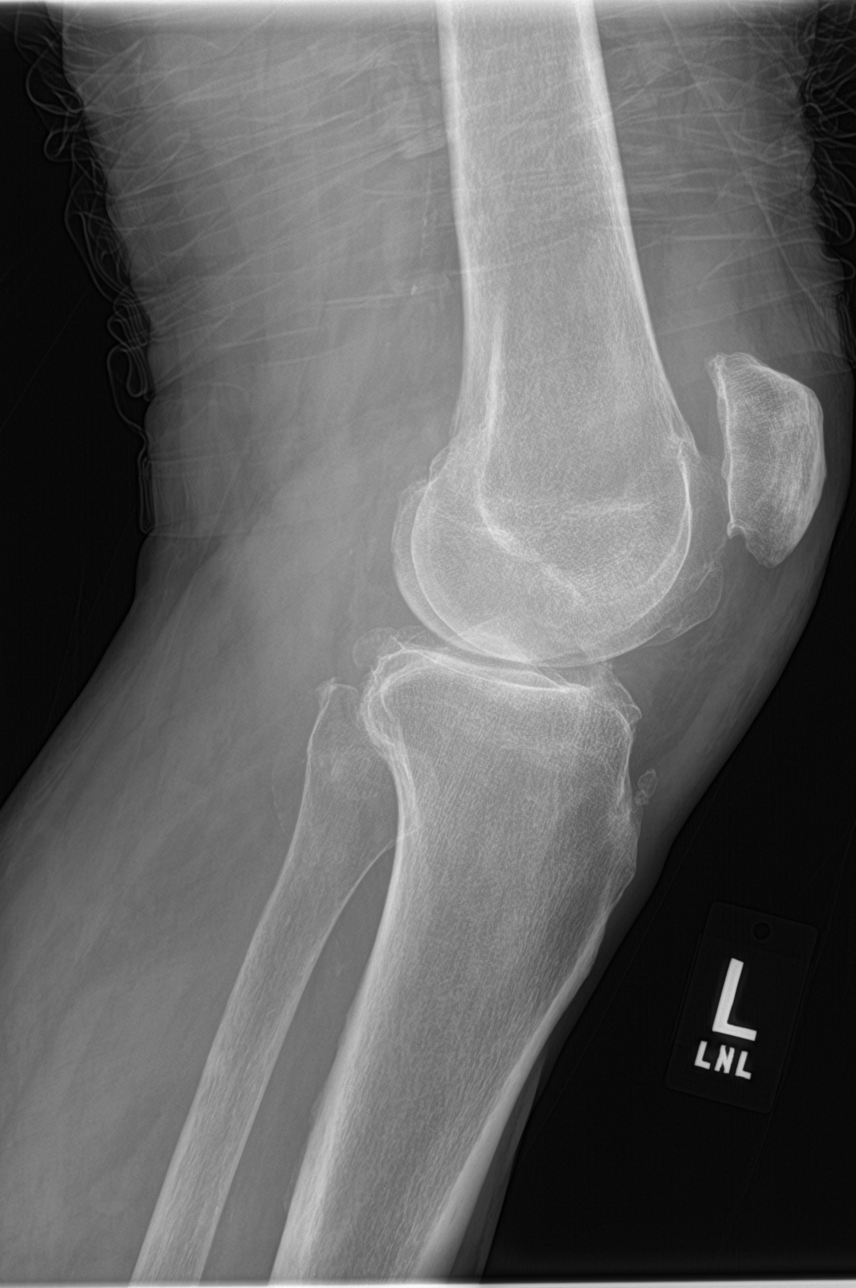

[knee obl (1 of 2)]
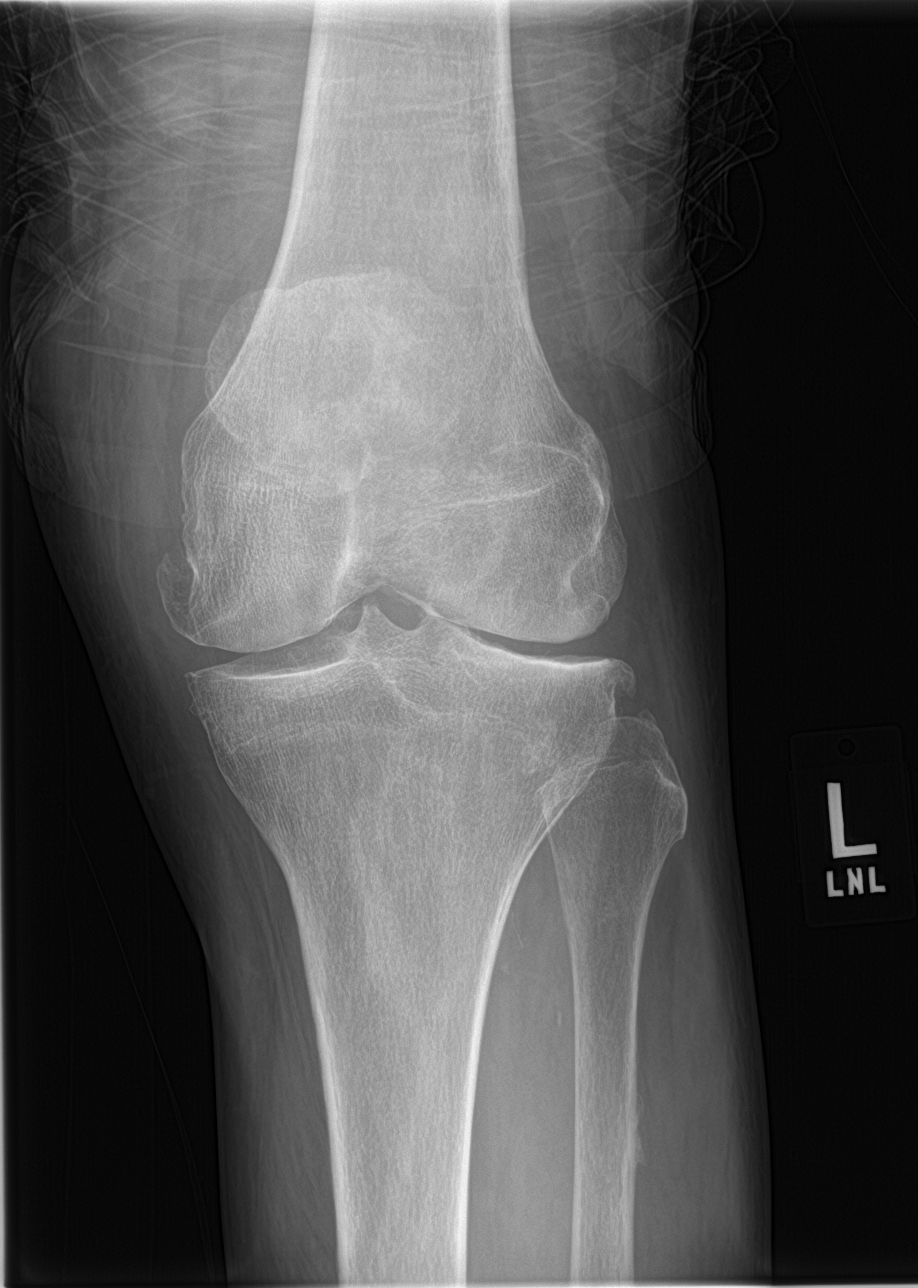

[knee obl (2 of 2)]
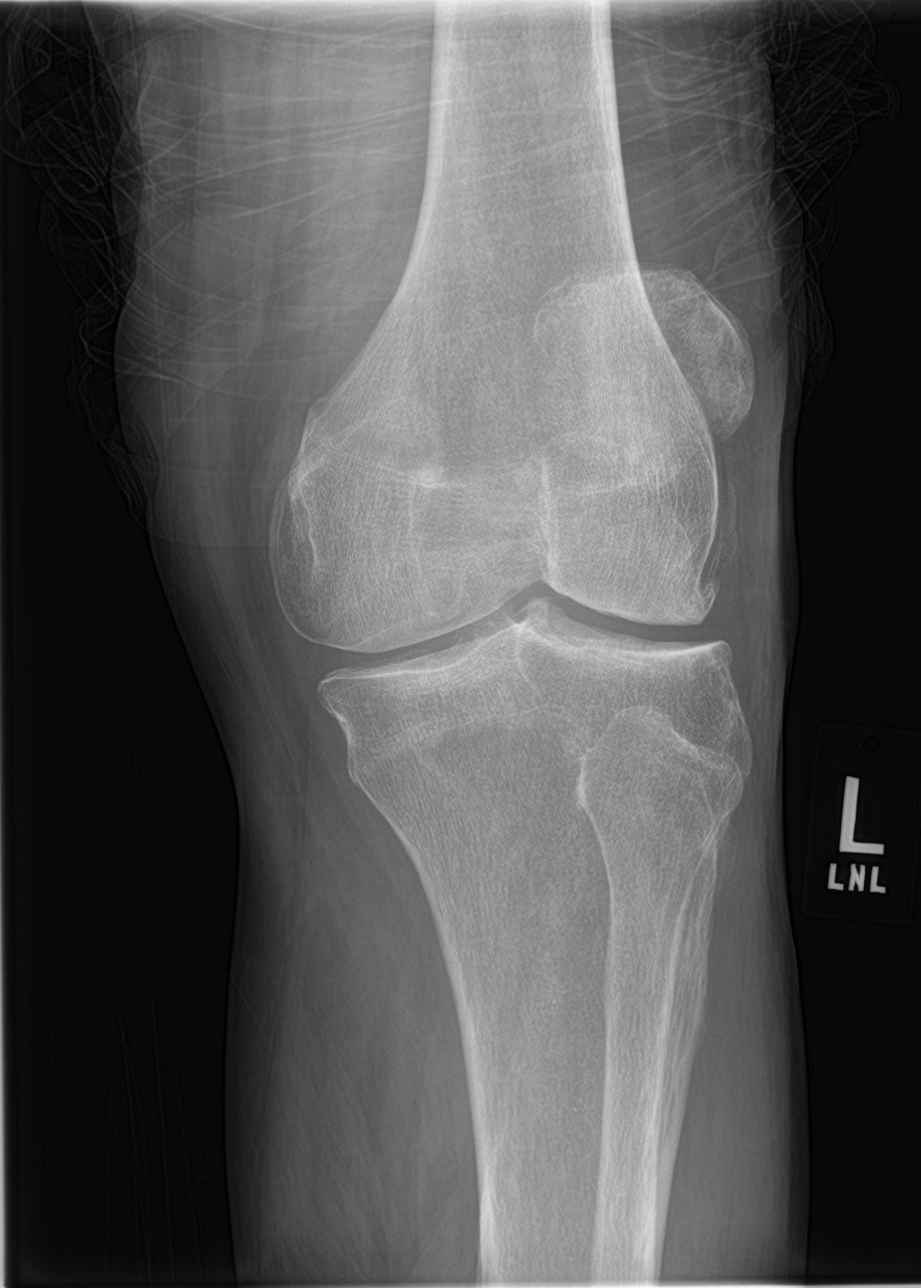

[4 of 4 positions shown; findings below may reference images not displayed]

FINDINGS: No acute fracture or dislocation identified. Moderate joint
effusion. Mild bilateral femorotibial compartment joint space
narrowing and small tricompartmental osteophytes.
IMPRESSION: 1.  No acute fracture or dislocation identified.
2. Moderate joint effusion.
3. Mild tricompartmental osteoarthrosis.

By: Dieugens Morilien M.D.

## 2018-12-09 IMAGING — CR DG ANKLE COMPLETE 3+V*L*
3 series · 3 of 3 positions shown · non-contrast
Comparison: None.

CLINICAL DATA: 71 y/o  M; fall and pain.

EXAM:
LEFT ANKLE COMPLETE - 3+ VIEW

[ankle ap]
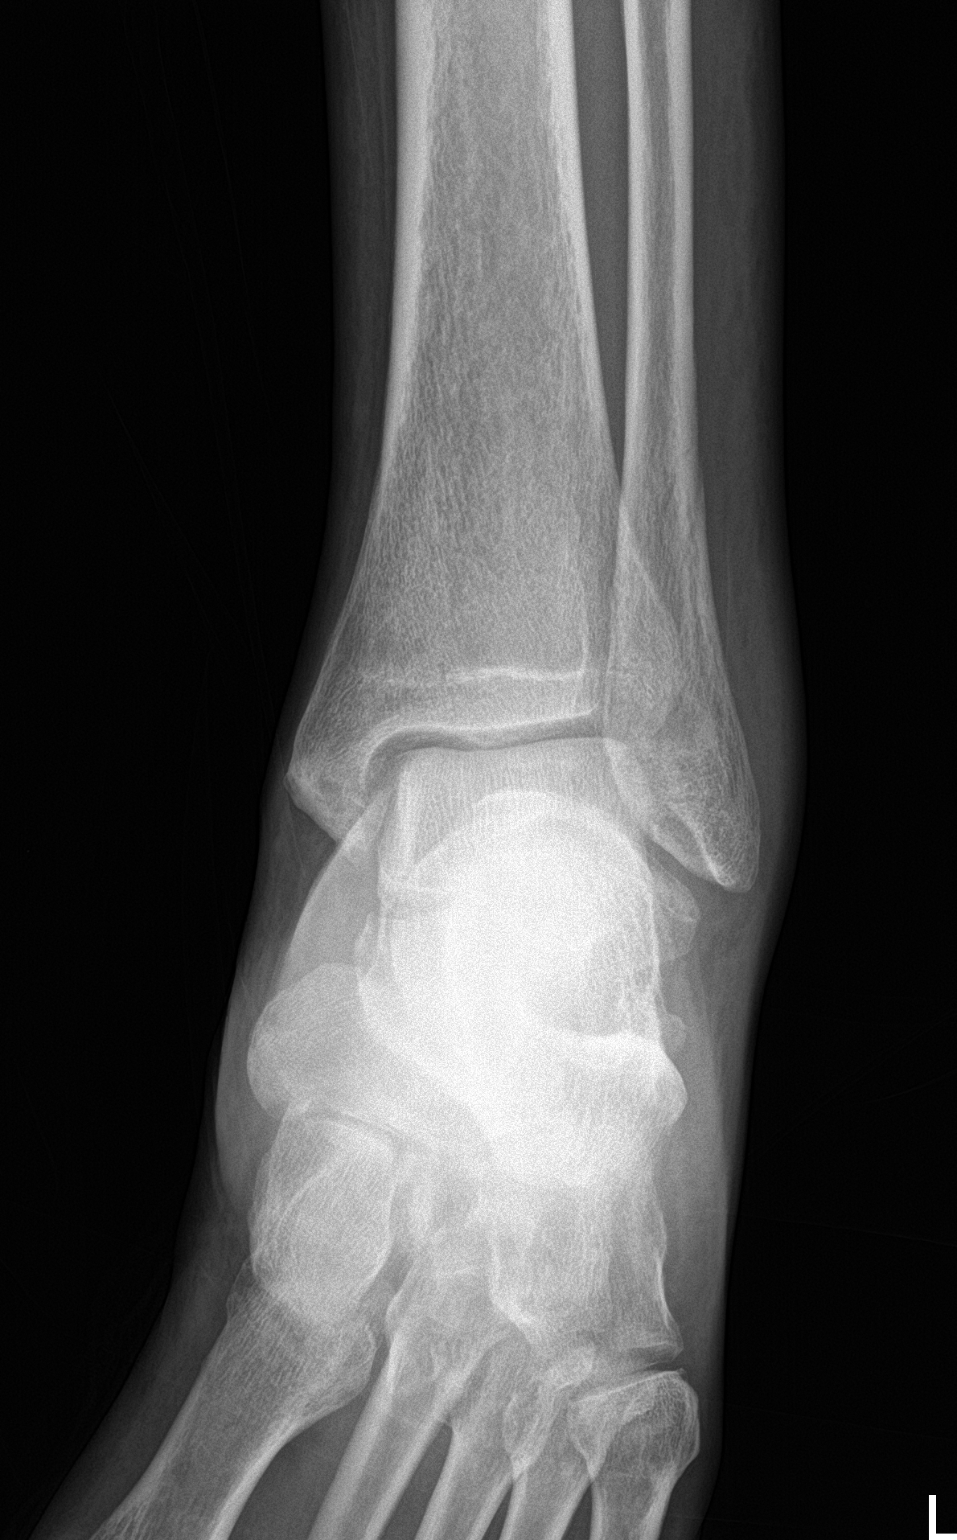

[ankle obl]
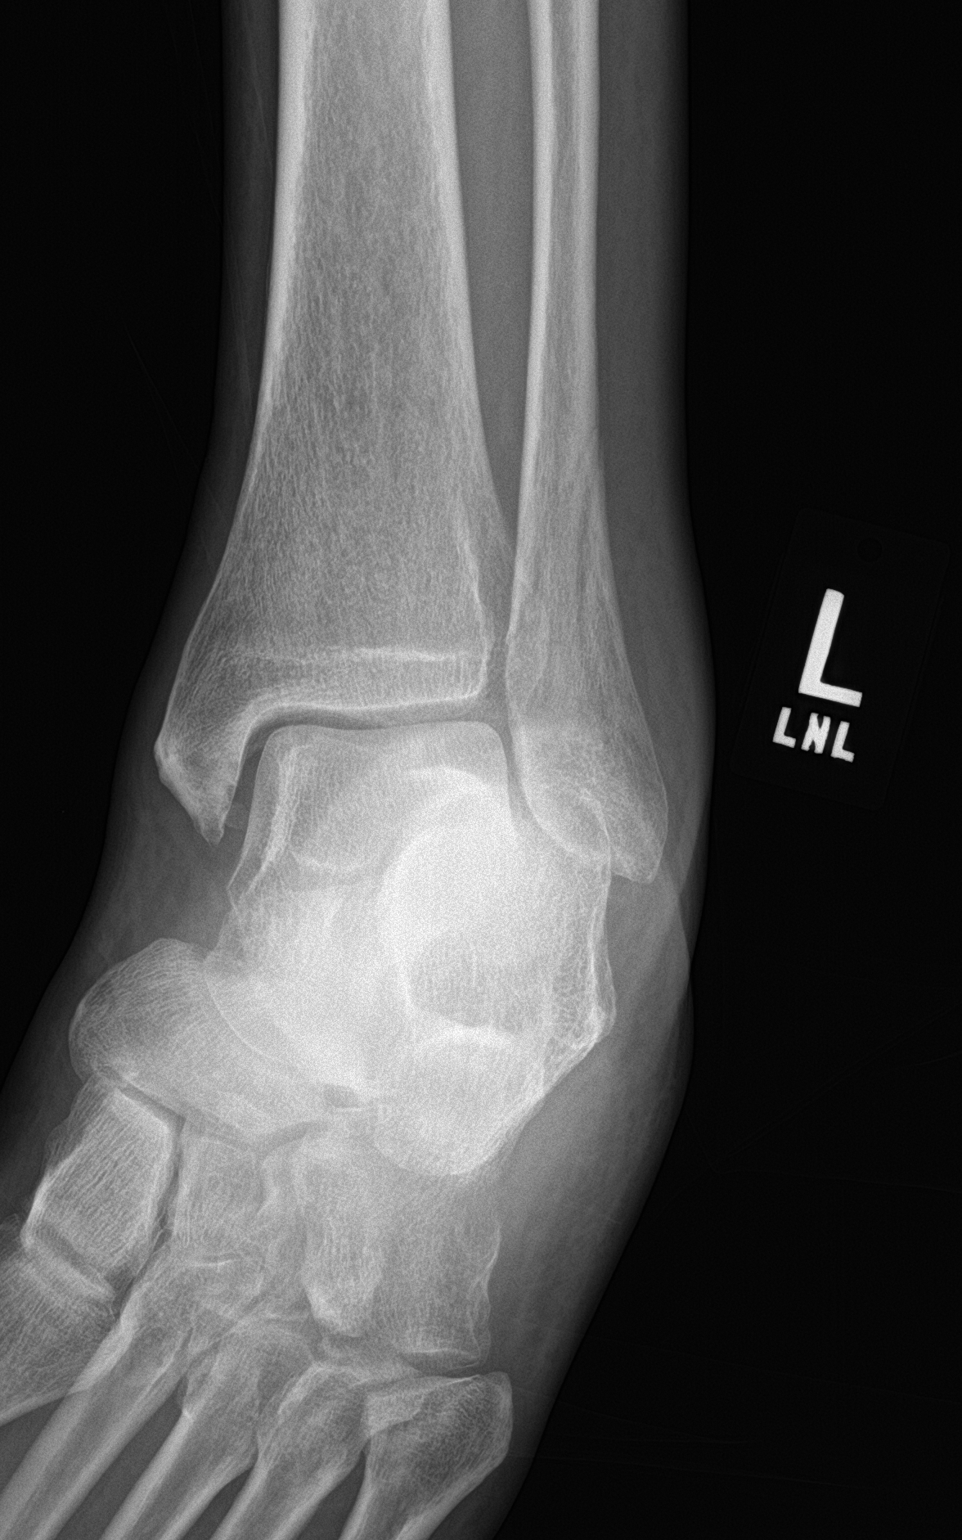

[ankle lat]
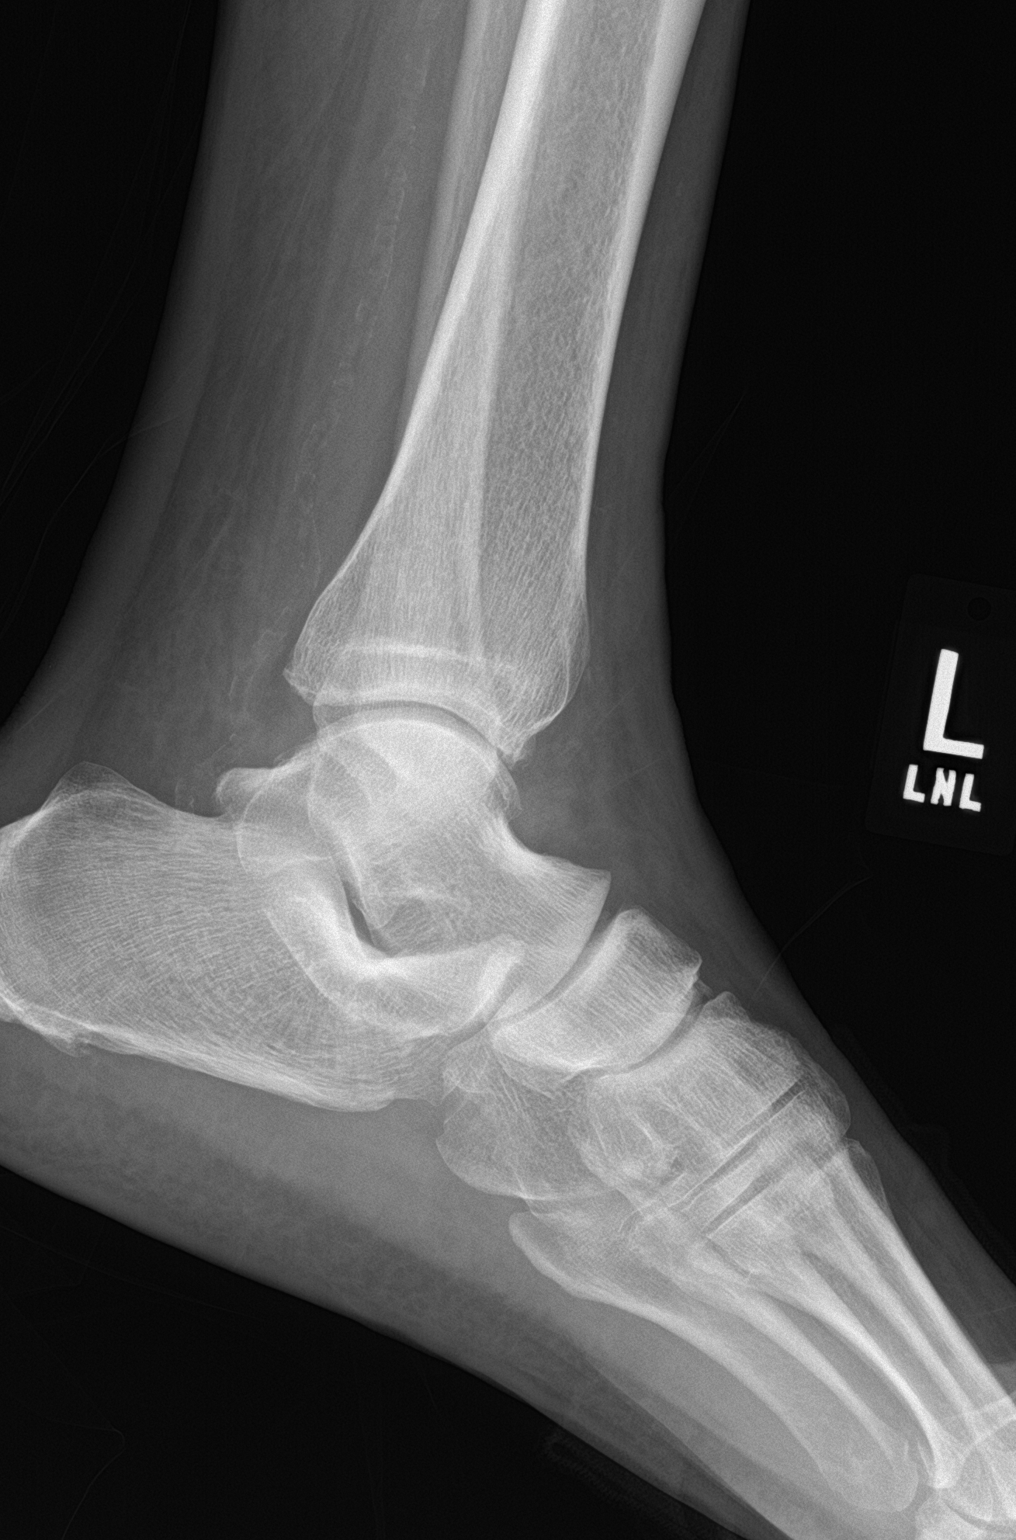

[3 of 3 positions shown; findings below may reference images not displayed]

FINDINGS: Oblique lucency of fibula superior tibial plafond may represent
nondisplaced fracture. Talar dome is intact. Ankle mortise is
symmetric on these nonstress views. Vascular calcifications.
IMPRESSION: Possible nondisplaced oblique fracture of fibula superior to tibial
plafond, correlation for focal tenderness recommended. No other
fracture or dislocation identified.

By: Vadnai Stotz M.D.

## 2018-12-31 ENCOUNTER — Ambulatory Visit (INDEPENDENT_AMBULATORY_CARE_PROVIDER_SITE_OTHER): Payer: BLUE CROSS/BLUE SHIELD | Admitting: Internal Medicine

## 2018-12-31 ENCOUNTER — Ambulatory Visit: Payer: Self-pay | Admitting: *Deleted

## 2018-12-31 ENCOUNTER — Ambulatory Visit: Payer: Self-pay

## 2018-12-31 DIAGNOSIS — J309 Allergic rhinitis, unspecified: Secondary | ICD-10-CM | POA: Diagnosis not present

## 2018-12-31 DIAGNOSIS — G4733 Obstructive sleep apnea (adult) (pediatric): Secondary | ICD-10-CM | POA: Diagnosis not present

## 2018-12-31 DIAGNOSIS — Z9989 Dependence on other enabling machines and devices: Secondary | ICD-10-CM | POA: Diagnosis not present

## 2018-12-31 DIAGNOSIS — I10 Essential (primary) hypertension: Secondary | ICD-10-CM | POA: Diagnosis not present

## 2018-12-31 MED ORDER — PREDNISONE 10 MG PO TABS: ORAL_TABLET | ORAL | 0 refills | Status: DC

## 2018-12-31 MED ORDER — FEXOFENADINE HCL 180 MG PO TABS: 180.0000 mg | ORAL_TABLET | Freq: Every day | ORAL | 3 refills | Status: AC

## 2018-12-31 NOTE — Patient Instructions (Signed)
See above

## 2018-12-31 NOTE — Telephone Encounter (Signed)
appt scheduled

## 2018-12-31 NOTE — Progress Notes (Signed)
Patient ID: Dalton Martin, male   DOB: [birth year], 73 y.o.   MRN: [id number]  Virtual Visit via Video Note  I connected with Simeon Craft on 12/31/18 at  1:00 PM EDT by a video enabled telemedicine application and verified that I am speaking with the correct person using two identifiers. Pt is at home, and I am in the office, no other persons present   I discussed the limitations of evaluation and management by telemedicine and the availability of in person appointments. The patient expressed understanding and agreed to proceed.  History of Present Illness: Here with acute complaint of sob coming from the nasal area; overall doing ok,  But Does have several wks ongoing nasal allergy symptoms with clearish but obstructive nasal congestion, itch and sneezing, and occasional rare scant productive cough, without fever, pain, ST, swelling or wheezing. Pt denies chest pain, increased sob or doe, wheezing, orthopnea, PND, increased LE swelling, palpitations, dizziness or syncope.  Has stable bilat ankle edema for many months.  BP this am at home 143/68.  Has been started on CPAP per [place] but not using as [place] the airport [place].  Has tried flonase and saline which do help, but the congestion keeps coming back.  No prior CV or pulm or hx or PE.  No other new complaints or med refills needed Past Medical History:  Diagnosis Date  . Allergic rhinitis   . Bilateral hearing loss 10/02/2018  . Cancer ([place])   . History of kidney stones   . History of prostate cancer    s/p  radical prostatectomy 2011  . Hyperlipidemia   . Hypertension   . OSA on CPAP    MILD OSA PER STUDY 2013  . Penile lesion   . Prostate cancer ([place]) 11/07/2017  . Wears hearing aid    BILATERAL   Past Surgical History:  Procedure Laterality Date  . CARDIAC CATHETERIZATION  07-01-2012  dr Angelena Form   normal coronary arteries/   ef  60-65%  . INGUINAL HERNIA REPAIR Bilateral as teen  . LEFT HEART CATHETERIZATION  WITH CORONARY ANGIOGRAM N/A 07/01/2012   Procedure: LEFT HEART CATHETERIZATION WITH CORONARY ANGIOGRAM;  Surgeon: Wellington Hampshire, MD;  Location: [facility];  Service: Cardiovascular;  Laterality: N/A;  . LESION DESTRUCTION N/A 05/27/2014   Procedure: EXCISION OF PENIS LESIONS;  Surgeon: Claybon Jabs, MD;  Location: [facility];  Service: Urology;  Laterality: N/A;  . PROSTATE SURGERY    . ROBOT ASSISTED LAPAROSCOPIC RADICAL PROSTATECTOMY  11-15-2009   right nerve spare/  bilateral pelvic lymphadenectomy    reports that he quit smoking about 45 years ago. His smoking use included cigarettes. He has a 4.00 pack-year smoking history. He has never used smokeless tobacco. He reports current alcohol use of about 4.0 standard drinks of alcohol per week. He reports that he does not use drugs. family history includes Cancer in his father; Liver disease in his mother. No Known Allergies Current Outpatient Medications on File Prior to Visit  Medication Sig Dispense Refill  . amLODipine (NORVASC) 10 MG tablet Take 1 tablet (10 mg total) by mouth daily. 90 tablet 3  . aspirin EC 81 MG EC tablet Take 1 tablet (81 mg total) by mouth daily. 30 tablet 0  . benzonatate (TESSALON) 100 MG capsule Take 1 capsule (100 mg total) by mouth every 8 (eight) hours. 21 capsule 0  . cetirizine (ZYRTEC) 5 MG tablet Take 1 tablet (5 mg  total) by mouth daily. 20 tablet 0  . cholecalciferol (VITAMIN D) 1000 UNITS tablet Take 1,000 Units by mouth daily. Reported on 12/19/2015    . clotrimazole-betamethasone (LOTRISONE) cream Apply topically.    . Cyanocobalamin (VITAMIN B-12 PO) Take 1 tablet by mouth daily.    . fluticasone (FLONASE) 50 MCG/ACT nasal spray Place 2 sprays into both nostrils daily. 16 g 0  . lisinopril (PRINIVIL,ZESTRIL) 40 MG tablet Take 1 tablet (40 mg total) by mouth every morning. Please make yearly appt with Doctor for December before anymore refills. 1st attempt 90 tablet 3  . meloxicam  (MOBIC) 7.5 MG tablet Take 1 tablet (7.5 mg total) by mouth daily as needed for pain. 90 tablet 3  . Multiple Vitamins-Minerals (MENS MULTIVITAMIN PLUS PO) Take 1 tablet by mouth daily.    . rosuvastatin (CRESTOR) 10 MG tablet Take 1 tablet (10 mg total) by mouth daily. 90 tablet 3  . senna-docusate (SENOKOT-S) 8.6-50 MG tablet Take 1 tablet by mouth at bedtime as needed for mild constipation. 30 tablet 0  . triamcinolone cream (KENALOG) 0.1 % Apply 1 application topically 2 (two) times daily as needed (rash).     . Vitamin D, Ergocalciferol, (DRISDOL) 50000 units CAPS capsule Take 1 capsule (50,000 Units total) by mouth every 7 (seven) days. 6 capsule 0   No current facility-administered medications on file prior to visit.     Observations/Objective: Alert, moderately bright non-nervous affect, non toxic and non ill appearing, cn 2-12 intact, moves all 4s with walking about the room, resp's unlabored, no evident accessory muscle retractions or tachypnea Lab Results  Component Value Date   WBC 5.9 10/02/2018   HGB 15.1 10/02/2018   HCT 45.2 10/02/2018   PLT 212.0 10/02/2018   GLUCOSE 101 (H) 10/02/2018   CHOL 241 (H) 10/02/2018   TRIG 207.0 (H) 10/02/2018   HDL 48.60 10/02/2018   LDLDIRECT 166.0 10/02/2018   LDLCALC 136 (H) 11/07/2017   ALT 15 10/02/2018   AST 17 10/02/2018   NA 139 10/02/2018   K 4.3 10/02/2018   CL 101 10/02/2018   CREATININE 1.09 10/02/2018   BUN 14 10/02/2018   CO2 27 10/02/2018   TSH 0.46 10/02/2018   PSA 0.00 (L) 10/02/2018   INR 1.07 07/01/2012   HGBA1C 5.9 10/02/2018   Assessment and Plan: Dyspnea - symptoms correllates exclusively to nasal congestion, most likely related to allergies, partially treated with flonase and saline.  No evidence for fever, illness, CP, wheezing or DOE.  Stable LE edema per pt.  Ok for add predpac asd, and allegra prn  OSA - urged to return to [place] soon for replacement CPAP machine and restart  HTN - mild elevated, to  continue to monitor and would consider increased for persistent BP > 104/90  Follow Up Instructions: To start predpac and allegra Check BP on regular basis See [place] provider for replacement CPAP machine and supplies   I discussed the assessment and treatment plan with the patient. The patient was provided an opportunity to ask questions and all were answered. The patient agreed with the plan and demonstrated an understanding of the instructions.   The patient was advised to call back or seek an in-person evaluation if the symptoms worsen or if the condition fails to improve as anticipated.   Cathlean Cower, MD

## 2018-12-31 NOTE — Telephone Encounter (Signed)
Incoming call from Patient who states that he was on a call with another Nurse and the call dropped. Patient was calling  Back.  Patient was inquiring about covid-19 Inquired Wanted to make an appointment for screening. Inquired about Sx he was experiencing.  B/p was 143/68Sob, denies fever.  Not aware of any exposure to Covid-19.  Does report a cough.  Provided contact number to [facility].  Patient voiced understanding.

## 2018-12-31 NOTE — Telephone Encounter (Signed)
Attempted to contact pt so that nurse triage can be completed; left message on voicemail [phone].

## 2018-12-31 NOTE — Telephone Encounter (Signed)
Pt called back; he states that yesterday he coughed up white, slimy secretions; nurse triage initiated and recommendations made per protocol; explained that this information would be sent to the office, and the provider will decide if a visit or testing is merited; also explained that he may be offered a virtual visit; he is agreeable to this; the pt can be contacted at [phone] , and a message can be left on the voicemail or at [email or web address]; will route to office for notification; notified Sam    Reason for Disposition . [1] Mild body aches, chills, diarrhea, headache, runny nose, or sore throat AND [2] within 14 days of COVID-Exposure  Protocols used: CORONAVIRUS (COVID-19) EXPOSURE-A-AH

## 2018-12-31 NOTE — Telephone Encounter (Signed)
FYI: Pt has a virtual visit at 1pm today.

## 2018-12-31 NOTE — Telephone Encounter (Signed)
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  Answer Assessment - Initial Assessment Questions 1. CLOSE CONTACT: "Who is the person with the confirmed or suspected COVID-19 infection that you were exposed to?"     Not sure; pt is a school bus driver 2. PLACE of CONTACT: "Where were you when you were exposed to COVID-19?" (e.g., home, school, medical waiting room; which city?)    Bus driver of handicapped students 3. TYPE of CONTACT: "How much contact was there?" (e.g., sitting next to, live in same house, work in same office, same building)     Same bus 4. DURATION of CONTACT: "How long were you in contact with the COVID-19 patient?" (e.g., a few seconds, passed by person, a few minutes, live with the patient)    hours 5. DATE of CONTACT: "When did you have contact with a COVID-19 patient?" (e.g., how many days ago)     Symptoms started 12/18/2018 6. TRAVEL: "Have you traveled out of the country recently?" If so, "When and where?"     * Also ask about out-of-state travel, since the CDC has identified some high risk cities for community spread in the [place].     * Note: Travel becomes less relevant if there is widespread community transmission where the patient lives.    No, no 7. COMMUNITY SPREAD: "Are there lots of cases or COVID-19 (community spread) where you live?" (See public health department website, if unsure)   * MAJOR community spread: high number of cases; numbers of cases are increasing; many people  hospitalized.   * MINOR community spread: low number of cases; not increasing; few or no people hospitalized     [place]  8. SYMPTOMS: "Do you have any symptoms?" (e.g., fever, cough, breathing difficulty)     Shortness of breath 9. PREGNANCY OR POSTPARTUM: "Is there any chance you are pregnant?" "When was your last menstrual period?" "Did you deliver in the last 2 weeks?"     n/a 10. HIGH RISK: "Do you have any heart or lung problems? Do you have a weak immune system?" (e.g., CHF, COPD, asthma, HIV positive, chemotherapy, renal failure, diabetes mellitus, sickle cell anemia)       *No Answer*  Protocols used: CORONAVIRUS (COVID-19) EXPOSURE-A-AH

## 2019-04-02 ENCOUNTER — Ambulatory Visit
Admission: EM | Admit: 2019-04-02 | Discharge: 2019-04-02 | Disposition: A | Discharge status: Home / Self Care | Payer: Medicare Other | Attending: Emergency Medicine | Admitting: Emergency Medicine

## 2019-04-02 ENCOUNTER — Other Ambulatory Visit: Payer: Self-pay

## 2019-04-02 DIAGNOSIS — L03112 Cellulitis of left axilla: Secondary | ICD-10-CM | POA: Diagnosis not present

## 2019-04-02 DIAGNOSIS — B9689 Other specified bacterial agents as the cause of diseases classified elsewhere: Secondary | ICD-10-CM | POA: Diagnosis not present

## 2019-04-02 MED ORDER — DOXYCYCLINE HYCLATE 100 MG PO CAPS: 100.0000 mg | ORAL_CAPSULE | Freq: Two times a day (BID) | ORAL | 0 refills | Status: DC

## 2019-04-02 NOTE — ED Triage Notes (Signed)
Pt states had a tick bite under lt upper arm a month ago and still has a raised red area he is concerned about.

## 2019-04-02 NOTE — ED Provider Notes (Signed)
[facility]    CSN: [id number] Arrival date & time: 04/02/19  1336      History   Chief Complaint Chief Complaint  Patient presents with  . Tick Removal    HPI Dalton Martin is a 73 y.o. male.   Dalton Martin presents with complaints of redness and swelling to left axilla s/p tick bite. States he pulled it off approximately 1 month ago and it has been waxing and waning in size and pain ever since. Has been worse than today, but states he feels he has let it go on long enough so sought treatment today. No known drainage. No fevers. No other rash. No joint ache or headache. Has had other tick bites in the past but denies any previous similar. No known MRSA hx.    ROS per HPI, negative if not otherwise mentioned.      Past Medical History:  Diagnosis Date  . Allergic rhinitis   . Bilateral hearing loss 10/02/2018  . Cancer ([place])   . History of kidney stones   . History of prostate cancer    s/p  radical prostatectomy 2011  . Hyperlipidemia   . Hypertension   . OSA on CPAP    MILD OSA PER STUDY 2013  . Penile lesion   . Prostate cancer ([place]) 11/07/2017  . Wears hearing aid    BILATERAL    Patient Active Problem List   Diagnosis Date Noted  . Bilateral hearing loss 10/02/2018  . Posterior neck pain 10/02/2018  . Preventative health care 11/07/2017  . Anemia 11/07/2017  . Hyperglycemia 11/07/2017  . Prostate cancer ([place]) 11/07/2017  . Hyperlipidemia   . Allergic rhinitis   . History of kidney stones   . OSA on CPAP   . Closed nondisplaced fracture of lateral malleolus of left fibula 11/03/2017  . Normal coronary arteries 09/07/2017  . Bradycardia 09/06/2017  . Hypotension 09/06/2017  . Fibula fracture 09/06/2017  . Lightheadedness 07/01/2012  . Hypertension, uncontrolled 07/01/2012    Past Surgical History:  Procedure Laterality Date  . CARDIAC CATHETERIZATION  07-01-2012  dr Angelena Form   normal coronary arteries/   ef  60-65%  . INGUINAL  HERNIA REPAIR Bilateral as teen  . LEFT HEART CATHETERIZATION WITH CORONARY ANGIOGRAM N/A 07/01/2012   Procedure: LEFT HEART CATHETERIZATION WITH CORONARY ANGIOGRAM;  Surgeon: Wellington Hampshire, MD;  Location: [facility];  Service: Cardiovascular;  Laterality: N/A;  . LESION DESTRUCTION N/A 05/27/2014   Procedure: EXCISION OF PENIS LESIONS;  Surgeon: Claybon Jabs, MD;  Location: [facility];  Service: Urology;  Laterality: N/A;  . PROSTATE SURGERY    . ROBOT ASSISTED LAPAROSCOPIC RADICAL PROSTATECTOMY  11-15-2009   right nerve spare/  bilateral pelvic lymphadenectomy       Home Medications    Prior to Admission medications   Medication Sig Start Date End Date Taking? Authorizing Provider  amLODipine (NORVASC) 10 MG tablet Take 1 tablet (10 mg total) by mouth daily. 10/02/18   Biagio Borg, MD  aspirin EC 81 MG EC tablet Take 1 tablet (81 mg total) by mouth daily. 09/09/17   Regalado, Belkys A, MD  benzonatate (TESSALON) 100 MG capsule Take 1 capsule (100 mg total) by mouth every 8 (eight) hours. 11/05/18   Wurst, [place], PA-C  cetirizine (ZYRTEC) 5 MG tablet Take 1 tablet (5 mg total) by mouth daily. 11/05/18   Wurst, [place], PA-C  cholecalciferol (VITAMIN D) 1000 UNITS tablet Take 1,000 Units by  mouth daily. Reported on 12/19/2015    [provider]  clotrimazole-betamethasone (LOTRISONE) cream Apply topically. 01/17/17   [provider]  Cyanocobalamin (VITAMIN B-12 PO) Take 1 tablet by mouth daily.    [provider]  doxycycline (VIBRAMYCIN) 100 MG capsule Take 1 capsule (100 mg total) by mouth 2 (two) times daily. 04/02/19   Zigmund Gottron, NP  fexofenadine (ALLEGRA) 180 MG tablet Take 1 tablet (180 mg total) by mouth daily. 12/31/18 12/31/19  Biagio Borg, MD  fluticasone (FLONASE) 50 MCG/ACT nasal spray Place 2 sprays into both nostrils daily. 11/05/18   Wurst, [place], PA-C  lisinopril (PRINIVIL,ZESTRIL) 40 MG tablet Take 1 tablet (40 mg total) by  mouth every morning. Please make yearly appt with Doctor for December before anymore refills. 1st attempt 10/02/18   Biagio Borg, MD  meloxicam (MOBIC) 7.5 MG tablet Take 1 tablet (7.5 mg total) by mouth daily as needed for pain. 10/02/18   Biagio Borg, MD  Multiple Vitamins-Minerals (MENS MULTIVITAMIN PLUS PO) Take 1 tablet by mouth daily.    [provider]  predniSONE (DELTASONE) 10 MG tablet 3 tabs by mouth per day for 3 days,2tabs per day for 3 days,1tab per day for 3 days 12/31/18   Biagio Borg, MD  rosuvastatin (CRESTOR) 10 MG tablet Take 1 tablet (10 mg total) by mouth daily. 10/02/18   Biagio Borg, MD  senna-docusate (SENOKOT-S) 8.6-50 MG tablet Take 1 tablet by mouth at bedtime as needed for mild constipation. 09/08/17   Regalado, Belkys A, MD  triamcinolone cream (KENALOG) 0.1 % Apply 1 application topically 2 (two) times daily as needed (rash).     [provider]  Vitamin D, Ergocalciferol, (DRISDOL) 50000 units CAPS capsule Take 1 capsule (50,000 Units total) by mouth every 7 (seven) days. 10/06/17   Aundra Dubin, PA-C    Family History Family History  Problem Relation Age of Onset  . Liver disease Mother   . Cancer Father     Social History Social History   Tobacco Use  . Smoking status: Former Smoker    Packs/day: 1.00    Years: 4.00    Pack years: 4.00    Types: Cigarettes    Quit date: 05/20/1973    Years since quitting: 45.8  . Smokeless tobacco: Never Used  Substance Use Topics  . Alcohol use: Yes    Alcohol/week: 4.0 standard drinks    Types: 4 Standard drinks or equivalent per week    Comment: weekends  . Drug use: No     Allergies   Patient has no known allergies.   Review of Systems Review of Systems   Physical Exam Triage Vital Signs ED Triage Vitals  Enc Vitals Group     BP 04/02/19 1348 (!) 160/80     Pulse Rate 04/02/19 1348 75     Resp 04/02/19 1348 18     Temp 04/02/19 1348 98.1 F (36.7 C)     Temp Source  04/02/19 1348 Oral     SpO2 04/02/19 1348 96 %     Weight --      Height --      Head Circumference --      Peak Flow --      Pain Score 04/02/19 1349 0     Pain Loc --      Pain Edu? --      Excl. in [place]? --    No data found.  Updated Vital  Signs BP (!) 160/80 (BP Location: Left Arm)   Pulse 75   Temp 98.1 F (36.7 C) (Oral)   Resp 18   SpO2 96%    Physical Exam Constitutional:      Appearance: Normal appearance.  HENT:     Head: Normocephalic and atraumatic.  Cardiovascular:     Rate and Rhythm: Normal rate.  Skin:      Neurological:     Mental Status: He is alert.      UC Treatments / Results  Labs (all labs ordered are listed, but only abnormal results are displayed) Labs Reviewed - No data to display  EKG   Radiology No results found.  Procedures Procedures (including critical care time)  Medications Ordered in UC Medications - No data to display  Initial Impression / Assessment and Plan / UC Course  I have reviewed the triage vital signs and the nursing notes.  Pertinent labs & imaging results that were available during my care of the patient were reviewed by me and considered in my medical decision making (see chart for details).     Abcess / cellulitis s/p tick bite. Did not feel incision and drainage was warranted here today, warm compresses and antibiotics provided. Return precautions provided. Patient verbalized understanding and agreeable to plan.   Final Clinical Impressions(s) / UC Diagnoses   Final diagnoses:  Cellulitis of left axilla     Discharge Instructions     Warm compresses 3-4 times a day to promote any potential drainage.  Complete course of antibiotics.  Tylenol as needed for pain.  If no improvement or if worsening don't hesitate to return or follow up with your primary care provider.    ED Prescriptions    Medication Sig Dispense Auth. Provider   doxycycline (VIBRAMYCIN) 100 MG capsule Take 1 capsule (100 mg  total) by mouth 2 (two) times daily. 20 capsule Zigmund Gottron, NP     Controlled Substance Prescriptions [place] Controlled Substance Registry consulted? Not Applicable   Zigmund Gottron, NP 04/02/19 1408

## 2019-04-02 NOTE — Discharge Instructions (Signed)
Warm compresses 3-4 times a day to promote any potential drainage.  Complete course of antibiotics.  Tylenol as needed for pain.  If no improvement or if worsening don't hesitate to return or follow up with your primary care provider.

## 2019-06-11 ENCOUNTER — Encounter: Payer: Self-pay | Admitting: Internal Medicine

## 2019-06-11 ENCOUNTER — Other Ambulatory Visit: Payer: Self-pay

## 2019-06-11 ENCOUNTER — Ambulatory Visit (INDEPENDENT_AMBULATORY_CARE_PROVIDER_SITE_OTHER): Payer: Medicare Other | Admitting: Internal Medicine

## 2019-06-11 VITALS — BP 142/78 | HR 83 | Temp 99.2°F | Ht 73.0 in | Wt 235.0 lb

## 2019-06-11 DIAGNOSIS — I10 Essential (primary) hypertension: Secondary | ICD-10-CM | POA: Diagnosis not present

## 2019-06-11 DIAGNOSIS — L989 Disorder of the skin and subcutaneous tissue, unspecified: Secondary | ICD-10-CM

## 2019-06-11 DIAGNOSIS — R739 Hyperglycemia, unspecified: Secondary | ICD-10-CM

## 2019-06-11 DIAGNOSIS — Z23 Encounter for immunization: Secondary | ICD-10-CM | POA: Diagnosis not present

## 2019-06-11 NOTE — Progress Notes (Signed)
Subjective:    Patient ID: Dalton Martin, male    DOB: 1946-09-08, 73 y.o.   MRN: [id number]  HPI  Here to f/u; overall doing ok,  Pt denies chest pain, increasing sob or doe, wheezing, orthopnea, PND, increased LE swelling, palpitations, dizziness or syncope.  Pt denies new neurological symptoms such as new headache, or facial or extremity weakness or numbness.  Pt denies polydipsia, polyuria, or low sugar episode.  Pt states overall good compliance with meds, mostly trying to follow appropriate diet, with wt overall stable,  ALso has a skin lesion now larger and tender irritated it seems to the left trapezoid ridge, mild, constant, sharp, worse to touch, better with nothing. Past Medical History:  Diagnosis Date  . Allergic rhinitis   . Bilateral hearing loss 10/02/2018  . Cancer ([place])   . History of kidney stones   . History of prostate cancer    s/p  radical prostatectomy 2011  . Hyperlipidemia   . Hypertension   . OSA on CPAP    MILD OSA PER STUDY 2013  . Penile lesion   . Prostate cancer ([place]) 11/07/2017  . Wears hearing aid    BILATERAL   Past Surgical History:  Procedure Laterality Date  . CARDIAC CATHETERIZATION  07-01-2012  dr Angelena Form   normal coronary arteries/   ef  60-65%  . INGUINAL HERNIA REPAIR Bilateral as teen  . LEFT HEART CATHETERIZATION WITH CORONARY ANGIOGRAM N/A 07/01/2012   Procedure: LEFT HEART CATHETERIZATION WITH CORONARY ANGIOGRAM;  Surgeon: [facility] Hampshire, MD;  Location: [facility]ATH [facility];  Service: Cardiovascular;  Laterality: N/A;  . LESION DESTRUCTION N/A 05/27/2014   Procedure: EXCISION OF PENIS LESIONS;  Surgeon: Claybon Jabs, MD;  Location: [facility];  Service: Urology;  Laterality: N/A;  . PROSTATE SURGERY    . ROBOT ASSISTED LAPAROSCOPIC RADICAL PROSTATECTOMY  11-15-2009   right nerve spare/  bilateral pelvic lymphadenectomy    reports that he quit smoking about 46 years ago. His smoking use included cigarettes. He has a 4.00  pack-year smoking history. He has never used smokeless tobacco. He reports current alcohol use of about 4.0 standard drinks of alcohol per week. He reports that he does not use drugs. family history includes Cancer in his father; Liver disease in his mother. No Known Allergies Current Outpatient Medications on File Prior to Visit  Medication Sig Dispense Refill  . amLODipine (NORVASC) 10 MG tablet Take 1 tablet (10 mg total) by mouth daily. 90 tablet 3  . aspirin EC 81 MG EC tablet Take 1 tablet (81 mg total) by mouth daily. 30 tablet 0  . cetirizine (ZYRTEC) 5 MG tablet Take 1 tablet (5 mg total) by mouth daily. 20 tablet 0  . cholecalciferol (VITAMIN D) 1000 UNITS tablet Take 1,000 Units by mouth daily. Reported on 12/19/2015    . clotrimazole-betamethasone (LOTRISONE) cream Apply topically.    . Cyanocobalamin (VITAMIN B-12 PO) Take 1 tablet by mouth daily.    . fexofenadine (ALLEGRA) 180 MG tablet Take 1 tablet (180 mg total) by mouth daily. 90 tablet 3  . fluticasone (FLONASE) 50 MCG/ACT nasal spray Place 2 sprays into both nostrils daily. 16 g 0  . lisinopril (PRINIVIL,ZESTRIL) 40 MG tablet Take 1 tablet (40 mg total) by mouth every morning. Please make yearly appt with Doctor for December before anymore refills. 1st attempt 90 tablet 3  . meloxicam (MOBIC) 7.5 MG tablet Take 1 tablet (7.5 mg total) by mouth daily as  needed for pain. 90 tablet 3  . Multiple Vitamins-Minerals (MENS MULTIVITAMIN PLUS PO) Take 1 tablet by mouth daily.    . rosuvastatin (CRESTOR) 10 MG tablet Take 1 tablet (10 mg total) by mouth daily. 90 tablet 3  . senna-docusate (SENOKOT-S) 8.6-50 MG tablet Take 1 tablet by mouth at bedtime as needed for mild constipation. 30 tablet 0  . triamcinolone cream (KENALOG) 0.1 % Apply 1 application topically 2 (two) times daily as needed (rash).     . Vitamin D, Ergocalciferol, (DRISDOL) 50000 units CAPS capsule Take 1 capsule (50,000 Units total) by mouth every 7 (seven) days. 6  capsule 0   No current facility-administered medications on file prior to visit.    Review of Systems  Constitutional: Negative for other unusual diaphoresis or sweats HENT: Negative for ear discharge or swelling Eyes: Negative for other worsening visual disturbances Respiratory: Negative for stridor or other swelling  Gastrointestinal: Negative for worsening distension or other blood Genitourinary: Negative for retention or other urinary change Musculoskeletal: Negative for other MSK pain or swelling Skin: Negative for color change or other new lesions Neurological: Negative for worsening tremors and other numbness  Psychiatric/Behavioral: Negative for worsening agitation or other fatigue All other system neg per pt    Objective:   Physical Exam BP (!) 142/78 (BP Location: Left Arm, Patient Position: Sitting, Cuff Size: Normal)   Pulse 83   Temp 99.2 F (37.3 C) (Oral)   Ht 6\' 1"  (1.854 m)   Wt 235 lb (106.6 kg)   SpO2 95%   BMI 31.00 kg/m  VS noted,  Constitutional: Pt appears in NAD HENT: Head: NCAT.  Right Ear: External ear normal.  Left Ear: External ear normal.  Eyes: . Pupils are equal, round, and reactive to light. Conjunctivae and EOM are normal Nose: without d/c or deformity Neck: Neck supple. Gross normal ROM Cardiovascular: Normal rate and regular rhythm.   Pulmonary/Chest: Effort normal and breath sounds without rales or wheezing.  Left upper trapezoid area with oval raised lesion 1 cm x 1.5 cm, brown, smooth, tender  Neurological: Pt is alert. At baseline orientation, motor grossly intact Skin: Skin is warm. No rashes, other new lesions, no LE edema Psychiatric: Pt behavior is normal without agitation  No other exam findings Lab Results  Component Value Date   WBC 5.9 10/02/2018   HGB 15.1 10/02/2018   HCT 45.2 10/02/2018   PLT 212.0 10/02/2018   GLUCOSE 101 (H) 10/02/2018   CHOL 241 (H) 10/02/2018   TRIG 207.0 (H) 10/02/2018   HDL 48.60 10/02/2018    LDLDIRECT 166.0 10/02/2018   LDLCALC 136 (H) 11/07/2017   ALT 15 10/02/2018   AST 17 10/02/2018   NA 139 10/02/2018   K 4.3 10/02/2018   CL 101 10/02/2018   CREATININE 1.09 10/02/2018   BUN 14 10/02/2018   CO2 27 10/02/2018   TSH 0.46 10/02/2018   PSA 0.00 (L) 10/02/2018   INR 1.07 07/01/2012   HGBA1C 5.9 10/02/2018      Assessment & Plan:

## 2019-06-11 NOTE — Patient Instructions (Addendum)
You had the flu shot today  You will be contacted regarding the referral for: General Surgury for the skin lesion to the left upper back  Please continue all other medications as before, and refills have been done if requested.  Please have the pharmacy call with any other refills you may need.  Please continue your efforts at being more active, low cholesterol diet, and weight control.  Please keep your appointments with your specialists as you may have planned  Please return in 6 months, or sooner if needed

## 2019-06-12 ENCOUNTER — Encounter: Payer: Self-pay | Admitting: Internal Medicine

## 2019-06-12 NOTE — Assessment & Plan Note (Signed)
?   Neurofibroma like but single lesion only, large, ok for general surgury referral

## 2019-06-12 NOTE — Assessment & Plan Note (Signed)
Mild elevated today, pt states BP stable < 140/90 at home, cont same tx

## 2019-06-12 NOTE — Assessment & Plan Note (Signed)
stable overall by history and exam, recent data reviewed with pt, and pt to continue medical treatment as before,  to f/u any worsening symptoms or concerns  

## 2019-09-14 ENCOUNTER — Other Ambulatory Visit: Payer: Self-pay | Admitting: General Surgery

## 2019-09-16 ENCOUNTER — Other Ambulatory Visit: Payer: Self-pay | Admitting: Internal Medicine

## 2019-09-16 DIAGNOSIS — I10 Essential (primary) hypertension: Secondary | ICD-10-CM

## 2019-09-18 ENCOUNTER — Other Ambulatory Visit: Payer: Self-pay | Admitting: Internal Medicine

## 2019-11-16 ENCOUNTER — Other Ambulatory Visit: Payer: Self-pay

## 2019-11-16 ENCOUNTER — Ambulatory Visit (INDEPENDENT_AMBULATORY_CARE_PROVIDER_SITE_OTHER): Payer: Medicare HMO | Admitting: Internal Medicine

## 2019-11-16 ENCOUNTER — Encounter: Payer: Self-pay | Admitting: Internal Medicine

## 2019-11-16 VITALS — BP 128/66 | Ht 73.0 in | Wt 229.4 lb

## 2019-11-16 DIAGNOSIS — E559 Vitamin D deficiency, unspecified: Secondary | ICD-10-CM

## 2019-11-16 DIAGNOSIS — E611 Iron deficiency: Secondary | ICD-10-CM

## 2019-11-16 DIAGNOSIS — E538 Deficiency of other specified B group vitamins: Secondary | ICD-10-CM

## 2019-11-16 DIAGNOSIS — E785 Hyperlipidemia, unspecified: Secondary | ICD-10-CM

## 2019-11-16 DIAGNOSIS — R739 Hyperglycemia, unspecified: Secondary | ICD-10-CM | POA: Diagnosis not present

## 2019-11-16 DIAGNOSIS — Z0000 Encounter for general adult medical examination without abnormal findings: Secondary | ICD-10-CM

## 2019-11-16 DIAGNOSIS — I10 Essential (primary) hypertension: Secondary | ICD-10-CM

## 2019-11-16 NOTE — Assessment & Plan Note (Signed)
stable overall by history and exam, recent data reviewed with pt, and pt to continue medical treatment as before,  to f/u any worsening symptoms or concerns  

## 2019-11-16 NOTE — Progress Notes (Signed)
Subjective:    Patient ID: Dalton Martin, male    DOB: 10-Aug-1946, 74 y.o.   MRN: [id number]  HPI  Here for wellness and f/u;  Overall doing ok;  Pt denies Chest pain, worsening SOB, DOE, wheezing, orthopnea, PND, worsening LE edema, palpitations, dizziness or syncope.  Pt denies neurological change such as new headache, facial or extremity weakness.  Pt denies polydipsia, polyuria, or low sugar symptoms. Pt states overall good compliance with treatment and medications, good tolerability, and has been trying to follow appropriate diet.  Pt denies worsening depressive symptoms, suicidal ideation or panic. No fever, night sweats, wt loss, loss of appetite, or other constitutional symptoms.  Pt states good ability with ADL's, has low fall risk, home safety reviewed and adequate, no other significant changes in hearing or vision, and only occasionally active with exercise.  S/p covid shot x 2 Past Medical History:  Diagnosis Date  . Allergic rhinitis   . Bilateral hearing loss 10/02/2018  . Cancer ([place])   . History of kidney stones   . History of prostate cancer    s/p  radical prostatectomy 2011  . Hyperlipidemia   . Hypertension   . OSA on CPAP    MILD OSA PER STUDY 2013  . Penile lesion   . Prostate cancer ([place]) 11/07/2017  . Wears hearing aid    BILATERAL   Past Surgical History:  Procedure Laterality Date  . CARDIAC CATHETERIZATION  07-01-2012  dr Angelena Form   normal coronary arteries/   ef  60-65%  . INGUINAL HERNIA REPAIR Bilateral as teen  . LEFT HEART CATHETERIZATION WITH CORONARY ANGIOGRAM N/A 07/01/2012   Procedure: LEFT HEART CATHETERIZATION WITH CORONARY ANGIOGRAM;  Surgeon: [place] Hampshire, MD;  Location: [facility] CATH LAB;  Service: Cardiovascular;  Laterality: N/A;  . LESION DESTRUCTION N/A 05/27/2014   Procedure: EXCISION OF PENIS LESIONS;  Surgeon: Claybon Jabs, MD;  Location: [facility];  Service: Urology;  Laterality: N/A;  . PROSTATE SURGERY    . ROBOT  ASSISTED LAPAROSCOPIC RADICAL PROSTATECTOMY  11-15-2009   right nerve spare/  bilateral pelvic lymphadenectomy    reports that he quit smoking about 46 years ago. His smoking use included cigarettes. He has a 4.00 pack-year smoking history. He has never used smokeless tobacco. He reports current alcohol use of about 4.0 standard drinks of alcohol per week. He reports that he does not use drugs. family history includes Cancer in his father; Liver disease in his mother. No Known Allergies Current Outpatient Medications on File Prior to Visit  Medication Sig Dispense Refill  . amLODipine (NORVASC) 10 MG tablet Take 1 tablet (10 mg total) by mouth daily. 90 tablet 3  . aspirin EC 81 MG EC tablet Take 1 tablet (81 mg total) by mouth daily. 30 tablet 0  . cetirizine (ZYRTEC) 5 MG tablet Take 1 tablet (5 mg total) by mouth daily. 20 tablet 0  . cholecalciferol (VITAMIN D) 1000 UNITS tablet Take 1,000 Units by mouth daily. Reported on 12/19/2015    . clotrimazole-betamethasone (LOTRISONE) cream Apply topically.    . Cyanocobalamin (VITAMIN B-12 PO) Take 1 tablet by mouth daily.    . fexofenadine (ALLEGRA) 180 MG tablet Take 1 tablet (180 mg total) by mouth daily. 90 tablet 3  . fluticasone (FLONASE) 50 MCG/ACT nasal spray Place 2 sprays into both nostrils daily. 16 g 0  . meloxicam (MOBIC) 7.5 MG tablet TAKE 1 TABLET BY MOUTH DAILY AS NEEDED FOR PAIN 90 tablet  3  . Multiple Vitamins-Minerals (MENS MULTIVITAMIN PLUS PO) Take 1 tablet by mouth daily.    . rosuvastatin (CRESTOR) 10 MG tablet Take 1 tablet (10 mg total) by mouth daily. Annual appt due in Febuary must see provider for future refills 90 tablet 0  . triamcinolone cream (KENALOG) 0.1 % Apply 1 application topically 2 (two) times daily as needed (rash).     . Vitamin D, Ergocalciferol, (DRISDOL) 50000 units CAPS capsule Take 1 capsule (50,000 Units total) by mouth every 7 (seven) days. 6 capsule 0  . lisinopril (ZESTRIL) 40 MG tablet Take 1  tablet (40 mg total) by mouth daily. (Patient not taking: Reported on 11/16/2019) 90 tablet 3  . senna-docusate (SENOKOT-S) 8.6-50 MG tablet Take 1 tablet by mouth at bedtime as needed for mild constipation. 30 tablet 0   No current facility-administered medications on file prior to visit.   Review of Systems All otherwise neg per pt     Objective:   Physical Exam BP 128/66 (BP Location: Left Arm, Patient Position: Sitting, Cuff Size: Large)   Ht 6\' 1"  (1.854 m)   Wt 229 lb 6.4 oz (104.1 kg)   BMI 30.27 kg/m  VS noted,  Constitutional: Pt appears in NAD HENT: Head: NCAT.  Right Ear: External ear normal.  Left Ear: External ear normal.  Eyes: . Pupils are equal, round, and reactive to light. Conjunctivae and EOM are normal Nose: without d/c or deformity Neck: Neck supple. Gross normal ROM Cardiovascular: Normal rate and regular rhythm.   Pulmonary/Chest: Effort normal and breath sounds without rales or wheezing.  Abd:  Soft, NT, ND, + BS, no organomegaly Neurological: Pt is alert. At baseline orientation, motor grossly intact Skin: Skin is warm. No rashes, other new lesions, no LE edema Psychiatric: Pt behavior is normal without agitation  All otherwise neg per pt  Lab Results  Component Value Date   WBC 5.9 10/02/2018   HGB 15.1 10/02/2018   HCT 45.2 10/02/2018   PLT 212.0 10/02/2018   GLUCOSE 101 (H) 10/02/2018   CHOL 241 (H) 10/02/2018   TRIG 207.0 (H) 10/02/2018   HDL 48.60 10/02/2018   LDLDIRECT 166.0 10/02/2018   LDLCALC 136 (H) 11/07/2017   ALT 15 10/02/2018   AST 17 10/02/2018   NA 139 10/02/2018   K 4.3 10/02/2018   CL 101 10/02/2018   CREATININE 1.09 10/02/2018   BUN 14 10/02/2018   CO2 27 10/02/2018   TSH 0.46 10/02/2018   PSA 0.00 (L) 10/02/2018   INR 1.07 07/01/2012   HGBA1C 5.9 10/02/2018       Assessment & Plan:

## 2019-11-16 NOTE — Patient Instructions (Addendum)
Please call If you change your mind about the colonoscopy  Please continue all other medications as before, and refills have been done if requested.  Please have the pharmacy call with any other refills you may need.  Please continue your efforts at being more active, low cholesterol diet, and weight control.  You are otherwise up to date with prevention measures today.  Please keep your appointments with your specialists as you may have planned  Please go to the LAB at the blood drawing area for the tests to be done  You will be contacted by phone if any changes need to be made immediately.  Otherwise, you will receive a letter about your results with an explanation, but please check with MyChart first.  Please remember to sign up for MyChart if you have not done so, as this will be important to you in the future with finding out test results, communicating by private email, and scheduling acute appointments online when needed.  Please make an Appointment to return in 6 months, or sooner if needed

## 2019-11-16 NOTE — Assessment & Plan Note (Signed)

## 2019-12-09 ENCOUNTER — Other Ambulatory Visit: Payer: Self-pay | Admitting: Internal Medicine

## 2019-12-20 ENCOUNTER — Other Ambulatory Visit: Payer: Self-pay | Admitting: Internal Medicine

## 2020-03-31 ENCOUNTER — Ambulatory Visit (INDEPENDENT_AMBULATORY_CARE_PROVIDER_SITE_OTHER): Payer: Medicare HMO | Admitting: Orthopaedic Surgery

## 2020-03-31 ENCOUNTER — Encounter: Payer: Self-pay | Admitting: Orthopaedic Surgery

## 2020-03-31 VITALS — Ht 73.0 in | Wt 229.4 lb

## 2020-03-31 DIAGNOSIS — M1712 Unilateral primary osteoarthritis, left knee: Secondary | ICD-10-CM | POA: Diagnosis not present

## 2020-03-31 NOTE — Progress Notes (Signed)
Office Visit Note   Patient: Dalton Martin           Date of Birth: [birth year]           MRN: [id number] Visit Date: 03/31/2020              Requested by: Biagio Borg, MD [place],  [place] [address] PCP: Biagio Borg, MD   Assessment & Plan: Visit Diagnoses:  1. Primary osteoarthritis of left knee     Plan: Impression is end-stage left knee DJD.  After thorough discussion on treatment options that included surgical and nonsurgical options he would like to continue with his current treatment.  He declined a cortisone injection, physical therapy.  Follow-Up Instructions: Return if symptoms worsen or fail to improve.   Orders:  No orders of the defined types were placed in this encounter.  No orders of the defined types were placed in this encounter.     Procedures: No procedures performed   Clinical Data: No additional findings.   Subjective: Chief Complaint  Patient presents with   Left Shoulder - Pain   Left Leg - Pain   Left Knee - Pain    [name] returns today for follow-up of chronic left knee pain.  I saw him about 2 years ago for end-stage DJD.  He has chronic constant pain.  He is using over-the-counter liniment gel.   Review of Systems  Constitutional: Negative.   All other systems reviewed and are negative.    Objective: Vital Signs: Ht 6\' 1"  (1.854 m)    Wt 229 lb 6.4 oz (104.1 kg)    BMI 30.27 kg/m   Physical Exam Vitals and nursing note reviewed.  Constitutional:      Appearance: He is well-developed.  Pulmonary:     Effort: Pulmonary effort is normal.  Abdominal:     Palpations: Abdomen is soft.  Skin:    General: Skin is warm.  Neurological:     Mental Status: He is alert and oriented to person, place, and time.  Psychiatric:        Behavior: Behavior normal.        Thought Content: Thought content normal.        Judgment: Judgment normal.     Ortho Exam Mild left knee shows slight valgus deformity.   Painful range of motion.  No joint effusion. Specialty Comments:  No specialty comments available.  Imaging: No results found.   PMFS History: Patient Active Problem List   Diagnosis Date Noted   Primary osteoarthritis of left knee 03/31/2020   Skin lesion 06/11/2019   Bilateral hearing loss 10/02/2018   Posterior neck pain 10/02/2018   Preventative health care 11/07/2017   Anemia 11/07/2017   Hyperglycemia 11/07/2017   Prostate cancer ([place]) 11/07/2017   Hyperlipidemia    Allergic rhinitis    History of kidney stones    OSA on CPAP    Closed nondisplaced fracture of lateral malleolus of left fibula 11/03/2017   Normal coronary arteries 09/07/2017   Bradycardia 09/06/2017   Hypotension 09/06/2017   Fibula fracture 09/06/2017   Lightheadedness 07/01/2012   Hypertension, uncontrolled 07/01/2012   Past Medical History:  Diagnosis Date   Allergic rhinitis    Bilateral hearing loss 10/02/2018   Cancer [facility])    History of kidney stones    History of prostate cancer    s/p  radical prostatectomy 2011   Hyperlipidemia    Hypertension    OSA  on CPAP    MILD OSA PER STUDY 2013   Penile lesion    Prostate cancer ([place]) 11/07/2017   Wears hearing aid    BILATERAL    Family History  Problem Relation Age of Onset   Liver disease Mother    Cancer Father     Past Surgical History:  Procedure Laterality Date   CARDIAC CATHETERIZATION  07-01-2012  dr Angelena Form   normal coronary arteries/   ef  60-65%   INGUINAL HERNIA REPAIR Bilateral as teen   LEFT HEART CATHETERIZATION WITH CORONARY ANGIOGRAM N/A 07/01/2012   Procedure: LEFT HEART CATHETERIZATION WITH CORONARY ANGIOGRAM;  Surgeon: [facility], MD;  Location: [facility];  Service: Cardiovascular;  Laterality: N/A;   LESION DESTRUCTION N/A 05/27/2014   Procedure: EXCISION OF PENIS LESIONS;  Surgeon: Claybon Jabs, MD;  Location: [facility];  Service: Urology;  Laterality:  N/A;   PROSTATE SURGERY     ROBOT ASSISTED LAPAROSCOPIC RADICAL PROSTATECTOMY  11-15-2009   right nerve spare/  bilateral pelvic lymphadenectomy   Social History   Occupational History   Not on file  Tobacco Use   Smoking status: Former Smoker    Packs/day: 1.00    Years: 4.00    Pack years: 4.00    Types: Cigarettes    Quit date: 05/20/1973    Years since quitting: 46.8   Smokeless tobacco: Never Used  Vaping Use   Vaping Use: Never used  Substance and Sexual Activity   Alcohol use: Yes    Alcohol/week: 4.0 standard drinks    Types: 4 Standard drinks or equivalent per week    Comment: weekends   Drug use: No   Sexual activity: Not on file

## 2020-04-27 ENCOUNTER — Telehealth: Payer: Self-pay | Admitting: Orthopaedic Surgery

## 2020-04-27 NOTE — Telephone Encounter (Signed)
Alen Blew from [place] called   She needs the appointment notes from July 2nd faxed over. She is also requesting the patient's work status as of that date.    Fax Number: [phone]

## 2020-04-28 NOTE — Telephone Encounter (Signed)
What is patient's work status?

## 2020-04-30 NOTE — Telephone Encounter (Signed)
No prolonged standing

## 2020-05-02 NOTE — Telephone Encounter (Signed)
Faxed OV Note & Work Note.

## 2020-05-23 ENCOUNTER — Telehealth: Payer: Self-pay | Admitting: Orthopaedic Surgery

## 2020-05-23 NOTE — Telephone Encounter (Signed)
Received vm from Moishe Spice [facility] case manager/[employer] Benton. Stating this case is being re-opened. Ph [phone], fax [phone]. I faxed 7/2 ov note to her.

## 2020-08-17 ENCOUNTER — Ambulatory Visit: Payer: Medicare HMO | Admitting: Orthopaedic Surgery

## 2020-08-31 ENCOUNTER — Ambulatory Visit: Payer: Self-pay

## 2020-08-31 ENCOUNTER — Ambulatory Visit (INDEPENDENT_AMBULATORY_CARE_PROVIDER_SITE_OTHER): Payer: Medicare HMO | Admitting: Orthopaedic Surgery

## 2020-08-31 ENCOUNTER — Encounter: Payer: Self-pay | Admitting: Orthopaedic Surgery

## 2020-08-31 ENCOUNTER — Other Ambulatory Visit: Payer: Self-pay

## 2020-08-31 VITALS — Ht 73.0 in | Wt 229.0 lb

## 2020-08-31 DIAGNOSIS — M79605 Pain in left leg: Secondary | ICD-10-CM | POA: Diagnosis not present

## 2020-08-31 MED ORDER — METHYLPREDNISOLONE ACETATE 40 MG/ML IJ SUSP
40.0000 mg | INTRAMUSCULAR | Status: AC | PRN
  Administered 2020-08-31: 40 mg via INTRA_ARTICULAR

## 2020-08-31 MED ORDER — LIDOCAINE HCL 1 % IJ SOLN
2.0000 mL | INTRAMUSCULAR | Status: AC | PRN
  Administered 2020-08-31: 2 mL

## 2020-08-31 MED ORDER — BUPIVACAINE HCL 0.25 % IJ SOLN
2.0000 mL | INTRAMUSCULAR | Status: AC | PRN
  Administered 2020-08-31: 2 mL via INTRA_ARTICULAR

## 2020-08-31 NOTE — Progress Notes (Signed)
Office Visit Note   Patient: Dalton Martin           Date of Birth: [birth year]           MRN: [id number] Visit Date: 08/31/2020              Requested by: Biagio Borg, MD [place],  [place] [id number] PCP: Biagio Borg, MD   Assessment & Plan: Visit Diagnoses:  1. Pain in left leg     Plan: Impression is 3 years status post left lateral malleolus fracture with nonoperative treatment, left knee advanced degenerative joint disease and chronic left lower back pain with left lower extremity radiculopathy.  In regards to the ankle, he is doing well without any issues.  He has reached maximal medical improvement with an impairment of 10% to the left ankle.  In regards to the left knee, we have injected this with cortisone today.  He will follow up with [place] as needed.  In regards to his back, we have referred him to physical therapy.  Follow-up with [place] as needed.  Total face to face encounter time was greater than 25 minutes and over half of this time was spent in counseling and/or coordination of care.  Follow-Up Instructions: Return if symptoms worsen or fail to improve.   Orders:  Orders Placed This Encounter  Procedures   Large Joint Inj: L knee   XR Lumbar Spine 2-3 Views   XR Knee Complete 4 Views Left   Ambulatory referral to Physical Therapy   No orders of the defined types were placed in this encounter.     Procedures: Large Joint Inj: L knee on 08/31/2020 11:19 AM Indications: pain Details: 22 G needle, anterolateral approach Medications: 2 mL lidocaine 1 %; 2 mL bupivacaine 0.25 %; 40 mg methylPREDNISolone acetate 40 MG/ML      Clinical Data: No additional findings.   Subjective: Chief Complaint  Patient presents with   Left Leg - Follow-up    HPI patient is a pleasant 74 year old gentleman who comes in today with left leg pain.  He is status post nonoperative treatment of a left ankle fracture back in December 2018 which was the  result of an injury which occurred at work.  This was filed under [employer].  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  He is having some pain at times to the ankle but the majority is to the left lower back rating to the lateral lower leg.  He notes that this is worse with ambulation.  He has been using liniment oil and taking meloxicam without significant relief of symptoms.  He denies any numbness, tingling or burning to left lower extremity.  Of note, he has been seen by [place] in the past for left knee DJD and is had cortisone injections.  No bowel or bladder dysfunction.  Review of Systems as detailed in HPI.  All others reviewed and are negative.   Objective: Vital Signs: Ht 6\' 1"  (1.854 m)    Wt 229 lb (103.9 kg)    BMI 30.21 kg/m   Physical Exam well-developed well-nourished gentleman in no acute distress.  Alert and oriented x3.  Ortho Exam left ankle exam shows no bony tenderness.  Full range of motion without pain.  Left knee exam shows range of motion from 0 to 120 degrees.  No joint line tenderness.  Mild patellofemoral crepitus.  Ligaments are stable.  Negative logroll, negative FADIR negative straight leg raise.  No tenderness the greater trochanter.  He has mild left-sided paraspinous tenderness.  No pain with lumbar flexion or extension.  No focal weakness.  He is neurovascular intact distally.  Specialty Comments:  No specialty comments available.  Imaging: XR Knee Complete 4 Views Left  Result Date: 08/31/2020 X-rays demonstrate marked tricompartmental degenerative changes  XR Lumbar Spine 2-3 Views  Result Date: 08/31/2020 X-rays demonstrate advanced multilevel spondylosis worse at L2-5.    PMFS History: Patient Active Problem List   Diagnosis Date Noted   Primary osteoarthritis of left knee 03/31/2020   Skin lesion 06/11/2019   Bilateral hearing loss 10/02/2018   Posterior neck pain 10/02/2018    Preventative health care 11/07/2017   Anemia 11/07/2017   Hyperglycemia 11/07/2017   Prostate cancer ([place]) 11/07/2017   Hyperlipidemia    Allergic rhinitis    History of kidney stones    OSA on CPAP    Closed nondisplaced fracture of lateral malleolus of left fibula 11/03/2017   Normal coronary arteries 09/07/2017   Bradycardia 09/06/2017   Hypotension 09/06/2017   Fibula fracture 09/06/2017   Lightheadedness 07/01/2012   Hypertension, uncontrolled 07/01/2012   Past Medical History:  Diagnosis Date   Allergic rhinitis    Bilateral hearing loss 10/02/2018   Cancer ([place])    History of kidney stones    History of prostate cancer    s/p  radical prostatectomy 2011   Hyperlipidemia    Hypertension    OSA on CPAP    MILD OSA PER STUDY 2013   Penile lesion    Prostate cancer ([place]) 11/07/2017   Wears hearing aid    BILATERAL    Family History  Problem Relation Age of Onset   Liver disease Mother    Cancer Father     Past Surgical History:  Procedure Laterality Date   CARDIAC CATHETERIZATION  07-01-2012  dr Angelena Form   normal coronary arteries/   ef  60-65%   INGUINAL HERNIA REPAIR Bilateral as teen   LEFT HEART CATHETERIZATION WITH CORONARY ANGIOGRAM N/A 07/01/2012   Procedure: LEFT HEART CATHETERIZATION WITH CORONARY ANGIOGRAM;  Surgeon: Wellington Hampshire, MD;  Location: [facility];  Service: Cardiovascular;  Laterality: N/A;   LESION DESTRUCTION N/A 05/27/2014   Procedure: EXCISION OF PENIS LESIONS;  Surgeon: Claybon Jabs, MD;  Location: [facility];  Service: Urology;  Laterality: N/A;   PROSTATE SURGERY     ROBOT ASSISTED LAPAROSCOPIC RADICAL PROSTATECTOMY  11-15-2009   right nerve spare/  bilateral pelvic lymphadenectomy   Social History   Occupational History   Not on file  Tobacco Use   Smoking status: Former Smoker    Packs/day: 1.00    Years: 4.00    Pack years: 4.00    Types: Cigarettes    Quit date: 05/20/1973     Years since quitting: 47.3   Smokeless tobacco: Never Used  Vaping Use   Vaping Use: Never used  Substance and Sexual Activity   Alcohol use: Yes    Alcohol/week: 4.0 standard drinks    Types: 4 Standard drinks or equivalent per week    Comment: weekends   Drug use: No   Sexual activity: Not on file

## 2021-08-13 ENCOUNTER — Telehealth: Payer: Self-pay

## 2021-08-13 NOTE — Telephone Encounter (Signed)
LVM for patient to call office to schedule an appt

## 2021-09-03 ENCOUNTER — Other Ambulatory Visit: Payer: Self-pay

## 2021-09-03 ENCOUNTER — Encounter (HOSPITAL_COMMUNITY): Payer: Self-pay

## 2021-09-03 ENCOUNTER — Emergency Department (HOSPITAL_COMMUNITY): Payer: Medicare HMO

## 2021-09-03 ENCOUNTER — Emergency Department (HOSPITAL_COMMUNITY)
Admission: EM | Admit: 2021-09-03 | Discharge: 2021-09-03 | Disposition: A | Discharge status: Home / Self Care | Payer: Medicare HMO | Attending: Emergency Medicine | Admitting: Emergency Medicine

## 2021-09-03 DIAGNOSIS — M79604 Pain in right leg: Secondary | ICD-10-CM | POA: Diagnosis present

## 2021-09-03 DIAGNOSIS — I10 Essential (primary) hypertension: Secondary | ICD-10-CM | POA: Diagnosis not present

## 2021-09-03 DIAGNOSIS — Z7982 Long term (current) use of aspirin: Secondary | ICD-10-CM | POA: Insufficient documentation

## 2021-09-03 DIAGNOSIS — Z79899 Other long term (current) drug therapy: Secondary | ICD-10-CM | POA: Insufficient documentation

## 2021-09-03 DIAGNOSIS — Z8546 Personal history of malignant neoplasm of prostate: Secondary | ICD-10-CM | POA: Insufficient documentation

## 2021-09-03 DIAGNOSIS — L299 Pruritus, unspecified: Secondary | ICD-10-CM | POA: Insufficient documentation

## 2021-09-03 DIAGNOSIS — Z87891 Personal history of nicotine dependence: Secondary | ICD-10-CM | POA: Insufficient documentation

## 2021-09-03 MED ORDER — MORPHINE SULFATE (PF) 4 MG/ML IV SOLN
4.0000 mg | Freq: Once | INTRAVENOUS | Status: AC
  Administered 2021-09-03: 4 mg via INTRAMUSCULAR
  Filled 2021-09-03: qty 1

## 2021-09-03 MED ORDER — METHOCARBAMOL 500 MG PO TABS
500.0000 mg | ORAL_TABLET | Freq: Once | ORAL | Status: AC
  Administered 2021-09-03: 500 mg via ORAL
  Filled 2021-09-03: qty 1

## 2021-09-03 NOTE — ED Notes (Signed)
[facility] has been called to transport patient home.

## 2021-09-03 NOTE — ED Provider Notes (Signed)
[facility] DEPT Provider Note   CSN: [id number] Arrival date & time: 09/03/21  4098     History Chief Complaint  Patient presents with   Leg Pain    Dalton Martin is a 75 y.o. male.  Patient presents with right leg pain.  He states has had this pain off and on for several months if not a year.  He had acute exacerbation of this pain about an hour prior to arrival.  Describes it as a aching cramping pain at the back of his right thigh near his right knee.  He is scheduled with his orthopedic team at the [facility] to have a knee replacement in January.  He took a pain medication at home he does not recall the name of.  Otherwise denies any recent falls or trauma.  Denies fevers or cough or vomiting or diarrhea.      Past Medical History:  Diagnosis Date   Allergic rhinitis    Bilateral hearing loss 10/02/2018   Cancer [facility])    History of kidney stones    History of prostate cancer    s/p  radical prostatectomy 2011   Hyperlipidemia    Hypertension    OSA on CPAP    MILD OSA PER STUDY 2013   Penile lesion    Prostate cancer ([place]) 11/07/2017   Wears hearing aid    BILATERAL    Patient Active Problem List   Diagnosis Date Noted   Primary osteoarthritis of left knee 03/31/2020   Skin lesion 06/11/2019   Bilateral hearing loss 10/02/2018   Posterior neck pain 10/02/2018   Preventative health care 11/07/2017   Anemia 11/07/2017   Hyperglycemia 11/07/2017   Prostate cancer ([place]) 11/07/2017   Hyperlipidemia    Allergic rhinitis    History of kidney stones    OSA on CPAP    Closed nondisplaced fracture of lateral malleolus of left fibula 11/03/2017   Normal coronary arteries 09/07/2017   Bradycardia 09/06/2017   Hypotension 09/06/2017   Fibula fracture 09/06/2017   Lightheadedness 07/01/2012   Hypertension, uncontrolled 07/01/2012    Past Surgical History:  Procedure Laterality Date   CARDIAC CATHETERIZATION  07-01-2012  dr Angelena Form   normal  coronary arteries/   ef  60-65%   INGUINAL HERNIA REPAIR Bilateral as teen   LEFT HEART CATHETERIZATION WITH CORONARY ANGIOGRAM N/A 07/01/2012   Procedure: LEFT HEART CATHETERIZATION WITH CORONARY ANGIOGRAM;  Surgeon: Wellington Hampshire, MD;  Location: [facility];  Service: Cardiovascular;  Laterality: N/A;   LESION DESTRUCTION N/A 05/27/2014   Procedure: EXCISION OF PENIS LESIONS;  Surgeon: Claybon Jabs, MD;  Location: [facility];  Service: Urology;  Laterality: N/A;   PROSTATE SURGERY     ROBOT ASSISTED LAPAROSCOPIC RADICAL PROSTATECTOMY  11-15-2009   right nerve spare/  bilateral pelvic lymphadenectomy       Family History  Problem Relation Age of Onset   Liver disease Mother    Cancer Father     Social History   Tobacco Use   Smoking status: Former    Packs/day: 1.00    Years: 4.00    Pack years: 4.00    Types: Cigarettes    Quit date: 05/20/1973    Years since quitting: 48.3   Smokeless tobacco: Never  Vaping Use   Vaping Use: Never used  Substance Use Topics   Alcohol use: Yes    Alcohol/week: 4.0 standard drinks    Types: 4 Standard drinks or equivalent  per week    Comment: weekends   Drug use: No    Home Medications Prior to Admission medications   Medication Sig Start Date End Date Taking? Authorizing Provider  amLODipine (NORVASC) 10 MG tablet TAKE 1 TABLET BY MOUTH EVERY DAY 12/09/19   Biagio Borg, MD  aspirin EC 81 MG EC tablet Take 1 tablet (81 mg total) by mouth daily. 09/09/17   Regalado, Belkys A, MD  cetirizine (ZYRTEC) 5 MG tablet Take 1 tablet (5 mg total) by mouth daily. 11/05/18   Wurst, [place], PA-C  cholecalciferol (VITAMIN D) 1000 UNITS tablet Take 1,000 Units by mouth daily. Reported on 12/19/2015    [provider]  clotrimazole-betamethasone (LOTRISONE) cream Apply topically. 01/17/17   [provider]  Cyanocobalamin (VITAMIN B-12 PO) Take 1 tablet by mouth daily.    [provider]  fexofenadine  (ALLEGRA) 180 MG tablet Take 1 tablet (180 mg total) by mouth daily. 12/31/18 12/31/19  Biagio Borg, MD  fluticasone (FLONASE) 50 MCG/ACT nasal spray Place 2 sprays into both nostrils daily. 11/05/18   Wurst, [place], PA-C  lisinopril (ZESTRIL) 40 MG tablet Take 1 tablet (40 mg total) by mouth daily. 09/16/19   Biagio Borg, MD  meloxicam (MOBIC) 7.5 MG tablet TAKE 1 TABLET BY MOUTH DAILY AS NEEDED FOR PAIN 09/16/19   Biagio Borg, MD  Multiple Vitamins-Minerals (MENS MULTIVITAMIN PLUS PO) Take 1 tablet by mouth daily.    [provider]  rosuvastatin (CRESTOR) 10 MG tablet Take 1 tablet (10 mg total) by mouth daily. 12/20/19   Biagio Borg, MD  senna-docusate (SENOKOT-S) 8.6-50 MG tablet Take 1 tablet by mouth at bedtime as needed for mild constipation. 09/08/17   Regalado, Belkys A, MD  triamcinolone cream (KENALOG) 0.1 % Apply 1 application topically 2 (two) times daily as needed (rash).     [provider]  Vitamin D, Ergocalciferol, (DRISDOL) 50000 units CAPS capsule Take 1 capsule (50,000 Units total) by mouth every 7 (seven) days. 10/06/17   Aundra Dubin, PA-C    Allergies    Amlodipine, Flunisolide, and Simvastatin  Review of Systems   Review of Systems  Constitutional:  Negative for fever.  HENT:  Negative for ear pain and sore throat.   Eyes:  Negative for pain.  Respiratory:  Negative for cough.   Cardiovascular:  Negative for chest pain.  Gastrointestinal:  Negative for abdominal pain.  Genitourinary:  Negative for flank pain.  Musculoskeletal:  Negative for back pain.  Skin:  Negative for color change and rash.  Neurological:  Negative for syncope.  All other systems reviewed and are negative.  Physical Exam Updated Vital Signs BP (!) 154/87   Pulse 60   Temp 98.4 F (36.9 C) (Oral)   Resp 18   SpO2 99%   Physical Exam Constitutional:      Appearance: He is well-developed.  HENT:     Head: Normocephalic.     Nose: Nose normal.  Eyes:      Extraocular Movements: Extraocular movements intact.  Cardiovascular:     Rate and Rhythm: Normal rate.  Pulmonary:     Effort: Pulmonary effort is normal.  Musculoskeletal:     Comments: Patient massaging the right distal posterior thigh region.  However on visual section The thigh appears within normal limits.  No erythema no abnormal warmth on palpation.  No tenderness on palpation no swelling noted.  Normal range of the right knee noted.  Neurovascularly intact distally  with dorsalis pulses 2+ bilateral lower extremities and normal distal cap refill.  Skin:    Coloration: Skin is not jaundiced.  Neurological:     Mental Status: He is alert. Mental status is at baseline.    ED Results / Procedures / Treatments   Labs (all labs ordered are listed, but only abnormal results are displayed) Labs Reviewed - No data to display  EKG None  Radiology DG Femur Min 2 Views Right  Result Date: 09/03/2021 CLINICAL DATA:  Atraumatic posterior right thigh pain. EXAM: RIGHT FEMUR 2 VIEWS COMPARISON:  None. FINDINGS: There is no evidence of an acute fracture or other focal bone lesions. Degenerative changes and a small joint effusion are seen within the visualized portion of the right knee. IMPRESSION: 1. No acute osseous abnormality. 2. Degenerative changes and a small joint effusion within the visualized portion of the right knee. Electronically Signed   By: Virgina Norfolk M.D.   On: 09/03/2021 15:08    Procedures Procedures   Medications Ordered in ED Medications  morphine 4 MG/ML injection 4 mg (4 mg Intramuscular Given 09/03/21 1403)  methocarbamol (ROBAXIN) tablet 500 mg (500 mg Oral Given 09/03/21 1402)    ED Course  I have reviewed the triage vital signs and the nursing notes.  Pertinent labs & imaging results that were available during my care of the patient were reviewed by me and considered in my medical decision making (see chart for details).    MDM Rules/Calculators/A&P                            Patient given medication here with improvement and resolution of symptoms.  X-ray otherwise shows no acute findings.  Patient states his symptoms are almost completely resolved.  He is states that his right foot feels itchy now otherwise no pain at this time.  Final Clinical Impression(s) / ED Diagnoses Final diagnoses:  Right leg pain    Rx / DC Orders ED Discharge Orders     None        Luna Fuse, MD 09/03/21 1515

## 2021-09-03 NOTE — ED Triage Notes (Addendum)
Pt BIB EMS, pt reports with right sided leg pain x 1 hr. Pt is due to have a right knee replacement in 1 month. Pt states that there is no injury just pain. Pt took 1 pill of his pain medication that he can't recall the name of.

## 2021-09-03 NOTE — ED Provider Notes (Signed)
Emergency Medicine Provider Triage Evaluation Note  Dalton Martin , a 75 y.o. male  was evaluated in triage.  Pt complains of worsening right leg pain since last night. Pt reports issues with his right leg for the past year. He is followed at [facility] and is scheduled for a knee replacement in January. He states he woke up in the middle of the night with pain shooting all the way from his hip down his leg which is typical when the pain is severe. He states he had no relief with medications prompting EMS call. He states while in the waiting room his wife massaged his leg and his pain is now gone.  Review of Systems  Positive: + leg pain Negative: - swelling, redness, increased warmth, fevers, weakness/numbness/tingling  Physical Exam  BP (!) 142/77   Pulse (!) 55   Temp 98.4 F (36.9 C) (Oral)   Resp 16   SpO2 96%  Gen:   Awake, no distress   Resp:  Normal effort  MSK:   Moves extremities without difficulty  Other:  ROM Intact to hip, knee, ankle. 2+ DP pulse. Able to wiggle toes without difficulty. Cap refill < 2 seconds. Slight discoloration noted to 1st-3rd toes on right foot which pt states is typical. No TTP to entire right leg with palpation.   Medical Decision Making  Medically screening exam initiated at 11:07 AM.  Appropriate orders placed.  Dalton Martin was informed that the remainder of the evaluation will be completed by another provider, this initial triage assessment does not replace that evaluation, and the importance of remaining in the ED until their evaluation is complete.     Eustaquio Maize, PA-C 09/03/21 1109    Dorie Rank, MD 09/05/21 1009

## 2021-09-03 NOTE — Discharge Instructions (Addendum)
Call your primary care doctor or specialist as discussed in the next 2-3 days.   Return immediately back to the ER if:  Your symptoms worsen within the next 12-24 hours. You develop new symptoms such as new fevers, persistent vomiting, new pain, shortness of breath, or new weakness or numbness, or if you have any other concerns.
